# Patient Record
Sex: Female | Born: 1995 | Race: Black or African American | Hispanic: No | Marital: Single | Smoking: Never smoker
Health system: Southern US, Community
[De-identification: ages and names within clinical notes are randomized; demographics above are authoritative.]

## PROBLEM LIST (undated history)

## (undated) ENCOUNTER — Inpatient Hospital Stay (HOSPITAL_COMMUNITY): Payer: Self-pay

## (undated) DIAGNOSIS — F419 Anxiety disorder, unspecified: Secondary | ICD-10-CM

## (undated) DIAGNOSIS — C8590 Non-Hodgkin lymphoma, unspecified, unspecified site: Secondary | ICD-10-CM

## (undated) DIAGNOSIS — K219 Gastro-esophageal reflux disease without esophagitis: Secondary | ICD-10-CM

## (undated) DIAGNOSIS — J45909 Unspecified asthma, uncomplicated: Secondary | ICD-10-CM

## (undated) DIAGNOSIS — F329 Major depressive disorder, single episode, unspecified: Secondary | ICD-10-CM

## (undated) DIAGNOSIS — O139 Gestational [pregnancy-induced] hypertension without significant proteinuria, unspecified trimester: Secondary | ICD-10-CM

## (undated) DIAGNOSIS — K5900 Constipation, unspecified: Secondary | ICD-10-CM

## (undated) DIAGNOSIS — B009 Herpesviral infection, unspecified: Secondary | ICD-10-CM

## (undated) DIAGNOSIS — F32A Depression, unspecified: Secondary | ICD-10-CM

## (undated) HISTORY — DX: Depression, unspecified: F32.A

## (undated) HISTORY — PX: OTHER SURGICAL HISTORY: SHX169

## (undated) HISTORY — DX: Gastro-esophageal reflux disease without esophagitis: K21.9

## (undated) HISTORY — PX: TONSILLECTOMY: SUR1361

## (undated) HISTORY — DX: Unspecified asthma, uncomplicated: J45.909

## (undated) HISTORY — DX: Constipation, unspecified: K59.00

## (undated) HISTORY — DX: Anxiety disorder, unspecified: F41.9

## (undated) HISTORY — PX: WISDOM TOOTH EXTRACTION: SHX21

## (undated) HISTORY — PX: LYMPH GLAND EXCISION: SHX13

---
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
## 2015-03-23 DIAGNOSIS — O0992 Supervision of high risk pregnancy, unspecified, second trimester: Secondary | ICD-10-CM | POA: Insufficient documentation

## 2015-03-25 DIAGNOSIS — Z283 Underimmunization status: Secondary | ICD-10-CM

## 2015-03-25 DIAGNOSIS — O09899 Supervision of other high risk pregnancies, unspecified trimester: Secondary | ICD-10-CM | POA: Insufficient documentation

## 2015-03-25 DIAGNOSIS — Z2839 Other underimmunization status: Secondary | ICD-10-CM | POA: Insufficient documentation

## 2015-05-22 DIAGNOSIS — O163 Unspecified maternal hypertension, third trimester: Secondary | ICD-10-CM | POA: Insufficient documentation

## 2015-09-13 DIAGNOSIS — O10919 Unspecified pre-existing hypertension complicating pregnancy, unspecified trimester: Secondary | ICD-10-CM | POA: Insufficient documentation

## 2015-09-18 ENCOUNTER — Encounter (HOSPITAL_COMMUNITY): Payer: Self-pay | Admitting: Emergency Medicine

## 2015-09-18 ENCOUNTER — Emergency Department (HOSPITAL_COMMUNITY): Payer: Medicaid Other

## 2015-09-18 ENCOUNTER — Emergency Department (HOSPITAL_COMMUNITY)
Admission: EM | Admit: 2015-09-18 | Discharge: 2015-09-18 | Disposition: A | Discharge status: Home / Self Care | Payer: Medicaid Other | Attending: Emergency Medicine | Admitting: Emergency Medicine

## 2015-09-18 DIAGNOSIS — Z8572 Personal history of non-Hodgkin lymphomas: Secondary | ICD-10-CM | POA: Insufficient documentation

## 2015-09-18 DIAGNOSIS — O9989 Other specified diseases and conditions complicating pregnancy, childbirth and the puerperium: Secondary | ICD-10-CM | POA: Insufficient documentation

## 2015-09-18 DIAGNOSIS — R000 Tachycardia, unspecified: Secondary | ICD-10-CM | POA: Insufficient documentation

## 2015-09-18 DIAGNOSIS — R1084 Generalized abdominal pain: Secondary | ICD-10-CM | POA: Diagnosis not present

## 2015-09-18 HISTORY — DX: Non-Hodgkin lymphoma, unspecified, unspecified site: C85.90

## 2015-09-18 LAB — COMPREHENSIVE METABOLIC PANEL
ALBUMIN: 2.9 g/dL — AB (ref 3.5–5.0)
ALK PHOS: 125 U/L (ref 38–126)
ALT: 16 U/L (ref 14–54)
ANION GAP: 9 (ref 5–15)
AST: 14 U/L — ABNORMAL LOW (ref 15–41)
BUN: 7 mg/dL (ref 6–20)
CALCIUM: 8.5 mg/dL — AB (ref 8.9–10.3)
CHLORIDE: 104 mmol/L (ref 101–111)
CO2: 24 mmol/L (ref 22–32)
Creatinine, Ser: 0.63 mg/dL (ref 0.44–1.00)
GFR calc non Af Amer: >60 mL/min (ref >60)
GLUCOSE: 96 mg/dL (ref 65–99)
POTASSIUM: 3.6 mmol/L (ref 3.5–5.1)
SODIUM: 137 mmol/L (ref 135–145)
Total Bilirubin: 0.5 mg/dL (ref 0.3–1.2)
Total Protein: 6.7 g/dL (ref 6.5–8.1)

## 2015-09-18 LAB — CBC
HEMATOCRIT: 35.9 % — AB (ref 36.0–46.0)
HEMOGLOBIN: 11.5 g/dL — AB (ref 12.0–15.0)
MCH: 26.3 pg (ref 26.0–34.0)
MCHC: 32 g/dL (ref 30.0–36.0)
MCV: 82.2 fL (ref 78.0–100.0)
Platelets: 316 10*3/uL (ref 150–400)
RBC: 4.37 MIL/uL (ref 3.87–5.11)
RDW: 13.7 % (ref 11.5–15.5)
WBC: 16.6 10*3/uL — ABNORMAL HIGH (ref 4.0–10.5)

## 2015-09-18 LAB — LIPASE, BLOOD: LIPASE: 24 U/L (ref 11–51)

## 2015-09-18 MED ORDER — IOHEXOL 300 MG/ML  SOLN
100.0000 mL | Freq: Once | INTRAMUSCULAR | Status: AC | PRN
  Administered 2015-09-18: 100 mL via INTRAVENOUS

## 2015-09-18 MED ORDER — IOHEXOL 300 MG/ML  SOLN
25.0000 mL | Freq: Once | INTRAMUSCULAR | Status: AC | PRN
  Administered 2015-09-18: 25 mL via ORAL

## 2015-09-18 MED ORDER — AMOXICILLIN-POT CLAVULANATE 875-125 MG PO TABS
1.0000 | ORAL_TABLET | Freq: Once | ORAL | Status: AC
  Administered 2015-09-18: 1 via ORAL
  Filled 2015-09-18: qty 1

## 2015-09-18 MED ORDER — AMOXICILLIN-POT CLAVULANATE 875-125 MG PO TABS: 1.0000 | ORAL_TABLET | Freq: Two times a day (BID) | ORAL | Status: DC

## 2015-09-18 MED ORDER — FENTANYL CITRATE (PF) 100 MCG/2ML IJ SOLN
25.0000 ug | Freq: Once | INTRAMUSCULAR | Status: AC
  Administered 2015-09-18: 25 ug via INTRAVENOUS
  Filled 2015-09-18: qty 2

## 2015-09-18 MED ORDER — SODIUM CHLORIDE 0.9 % IV BOLUS (SEPSIS)
1000.0000 mL | Freq: Once | INTRAVENOUS | Status: AC
  Administered 2015-09-18: 1000 mL via INTRAVENOUS

## 2015-09-18 NOTE — ED Notes (Signed)
Pt. Is unable to urinate at this time.  

## 2015-09-18 NOTE — ED Provider Notes (Signed)
CSN: [id number]     Arrival date & time 09/18/15  0932 History   First MD Initiated Contact with Patient 09/18/15 1314     Chief Complaint  Patient presents with  . Abdominal Pain    pain in abdomen x 6 hours. . Pt is 5 days postpartum     (Consider location/radiation/quality/duration/timing/severity/associated sxs/prior Treatment) HPI   She presents with concern of abdominal pain. Symptoms began within the past 12 hours, insulin sensitive been persistent with mild diffuse abdominal discomfort. No associated nausea, vomiting, diarrhea, no dysuria. She describes vaginal discharge as typical since delivery of her child 5 days ago. She reports that delivery was uneventful. She denies fever at home. She states that her recovery has been otherwise uneventful since delivery. This was her second child. She denies other medical problems, though acknowledges a history of non-Hodgkin's lymphoma as a child.    Past Medical History  Diagnosis Date  . NVD (normal vaginal delivery)     x2  . Non Hodgkin's lymphoma ([place])     at age 60   Past Surgical History  Procedure Laterality Date  . Lymph gland excision    . Tonsillectomy     Family History  Problem Relation Age of Onset  . Diabetes Mother   . Hypertension Mother   . Diabetes Father   . Hypertension Father    Social History  Substance Use Topics  . Smoking status: Never Smoker   . Smokeless tobacco: None  . Alcohol Use: No   OB History    Gravida Para Term Preterm AB TAB SAB Ectopic Multiple Living   2 2 2             Review of Systems  Constitutional:       Per HPI, otherwise negative  HENT:       Per HPI, otherwise negative  Respiratory:       Per HPI, otherwise negative  Cardiovascular:       Per HPI, otherwise negative  Gastrointestinal: Negative for vomiting.  Endocrine:       Negative aside from HPI  Genitourinary:       Neg aside from HPI   Musculoskeletal:       Per HPI, otherwise negative  Skin:  Negative.   Neurological: Negative for syncope.      Allergies  Review of patient's allergies indicates no known allergies.  Home Medications   Prior to Admission medications   Not on File   BP 131/87 mmHg  Pulse 119  Temp(Src) 99.8 F (37.7 C) (Oral)  Resp 18  SpO2 100%  LMP 12/09/2014 (Approximate) Physical Exam  Constitutional: She is oriented to person, place, and time. She appears well-developed and well-nourished. No distress.  HENT:  Head: Normocephalic and atraumatic.  Eyes: Conjunctivae and EOM are normal.  Cardiovascular: Regular rhythm.  Tachycardia present.   Pulmonary/Chest: Effort normal and breath sounds normal. No stridor. No respiratory distress.  Abdominal: She exhibits no distension.  Mild diffuse tenderness throughout the abdomen, no rebound, no guarding  Musculoskeletal: She exhibits no edema.  Neurological: She is alert and oriented to person, place, and time. No cranial nerve deficit.  Skin: Skin is warm and dry.  Psychiatric: She has a normal mood and affect.  Nursing note and vitals reviewed.   ED Course  Procedures (including critical care time) Labs Review Labs Reviewed  COMPREHENSIVE METABOLIC PANEL - Abnormal; Notable for the following:    Calcium 8.5 (*)    Albumin 2.9 (*)  AST 14 (*)    All other components within normal limits  CBC - Abnormal; Notable for the following:    WBC 16.6 (*)    Hemoglobin 11.5 (*)    HCT 35.9 (*)    All other components within normal limits  LIPASE, BLOOD  URINALYSIS, ROUTINE W REFLEX MICROSCOPIC (NOT AT [facility])    With concern for appendicitis, though endometritis seems more likely, CT scan ordered.  Imaging Review Ct Abdomen Pelvis W Contrast  09/18/2015  CLINICAL DATA:  Right lower quadrant pain. Vaginal delivery 5 days ago. Fever and elevated white count. EXAM: CT ABDOMEN AND PELVIS WITH CONTRAST TECHNIQUE: Multidetector CT imaging of the abdomen and pelvis was performed using the standard  protocol following bolus administration of intravenous contrast. CONTRAST:  45mL OMNIPAQUE IOHEXOL 300 MG/ML SOLN, 152mL OMNIPAQUE IOHEXOL 300 MG/ML SOLN COMPARISON:  None. FINDINGS: Lung bases are clear.  No pleural or pericardial fluid. The liver has normal appearance without focal lesions or biliary ductal dilatation. The patient does have gallstones. No sign of gallbladder inflammation by CT. The spleen is normal. The pancreas is normal. The adrenal glands are normal. The kidneys are normal. The aorta and IVC are normal. No retroperitoneal mass or adenopathy. The appendix is normal. Typical postpartum uterine enlargement without apparent complicating feature. No evidence of ovarian pathology. No free fluid. Bony structures appear unremarkable. IMPRESSION: No cause of pain, fever or elevated white count identified by CT. The patient does have cholelithiasis but there is no CT evidence of cholecystitis or obstruction. That is not excluded. Normal appearance of the postpartum uterus. Normal appearance of the appendix. Electronically Signed   By: Nelson Chimes M.D.   On: 09/18/2015 15:23   I have personally reviewed and evaluated these images and lab results as part of my medical decision-making.   3:37 PM On repeat exam the patient remains in no distress. Tachycardia has diminished. I discussed patient's case with her obstetrics team, reviewed the CT findings, labs, concern for endometritis per They will arrange follow-up tomorrow with her via telephone.  MDM  Young healthy female presents for days after delivery with diffuse abdominal pain. Patient has mild temperature increase here, diffuse abdominal pain, but no peritonitis. Patient's CT scan was reassuring, and physical exam findings are consistent with mild endometritis. Patient prefers outpatient management. Patient started on oral antibiotic, discharged with prearranged follow-up care.   Carmin Muskrat, MD 09/18/15 [phone]

## 2015-09-18 NOTE — Discharge Instructions (Signed)
As discussed, it is important that you follow up as soon as possible with your physician for continued management of your condition.  Your symptoms are likely due to endometritis, which is inflammation of the uterus.  If you develop any new, or concerning changes in your condition, please return to the emergency department immediately.

## 2015-09-18 NOTE — ED Notes (Signed)
Pt reports lower abdominal pain 5 days postpartum. Pt stated that she had a NVD 5 days ago. Healthy newborn, no complications. Pt denies NVD, denies fever at home. Vaginal discharge "normal , dark red with clots"

## 2015-09-18 NOTE — ED Notes (Signed)
Patient transported to CT 

## 2016-06-01 ENCOUNTER — Emergency Department (HOSPITAL_COMMUNITY)
Admission: EM | Admit: 2016-06-01 | Discharge: 2016-06-01 | Disposition: A | Discharge status: Home / Self Care | Payer: Medicaid Other | Attending: Emergency Medicine | Admitting: Emergency Medicine

## 2016-06-01 ENCOUNTER — Encounter (HOSPITAL_COMMUNITY): Payer: Self-pay

## 2016-06-01 DIAGNOSIS — B9689 Other specified bacterial agents as the cause of diseases classified elsewhere: Secondary | ICD-10-CM | POA: Insufficient documentation

## 2016-06-01 DIAGNOSIS — J028 Acute pharyngitis due to other specified organisms: Secondary | ICD-10-CM | POA: Insufficient documentation

## 2016-06-01 LAB — RAPID STREP SCREEN (MED CTR MEBANE ONLY): STREPTOCOCCUS, GROUP A SCREEN (DIRECT): NEGATIVE

## 2016-06-01 MED ORDER — IBUPROFEN 100 MG/5ML PO SUSP
800.0000 mg | Freq: Once | ORAL | Status: AC
  Administered 2016-06-01: 800 mg via ORAL
  Filled 2016-06-01: qty 40

## 2016-06-01 NOTE — ED Provider Notes (Signed)
[facility] DEPT Provider Note   CSN: [id number] Arrival date & time: 06/01/16  1336     History   Chief Complaint Chief Complaint  Patient presents with  . Sore Throat    HPI Christina Gilbert is a 20 y.o. female.  The history is provided by the patient.  Sore Throat  This is a new problem. The current episode started 2 days ago. The problem occurs constantly. The problem has been gradually worsening. Pertinent negatives include no chest pain, no abdominal pain, no headaches and no shortness of breath. Nothing aggravates the symptoms. Nothing relieves the symptoms. She has tried nothing for the symptoms. The treatment provided no relief.    Past Medical History:  Diagnosis Date  . Non Hodgkin's lymphoma ([place])    at age 24  . NVD (normal vaginal delivery)    x2    There are no active problems to display for this patient.   Past Surgical History:  Procedure Laterality Date  . LYMPH GLAND EXCISION    . TONSILLECTOMY      OB History    Gravida Para Term Preterm AB Living   2 2 2          SAB TAB Ectopic Multiple Live Births                   Home Medications    Prior to Admission medications   Medication Sig Start Date End Date Taking? Authorizing Provider  amoxicillin-clavulanate (AUGMENTIN) 875-125 MG tablet Take 1 tablet by mouth 2 (two) times daily. 09/18/15   Carmin Muskrat, MD    Family History Family History  Problem Relation Age of Onset  . Diabetes Mother   . Hypertension Mother   . Diabetes Father   . Hypertension Father     Social History Social History  Substance Use Topics  . Smoking status: Never Smoker  . Smokeless tobacco: Never Used  . Alcohol use No     Allergies   Review of patient's allergies indicates no known allergies.   Review of Systems Review of Systems  Constitutional: Negative for chills and fever.  HENT: Positive for congestion and rhinorrhea. Negative for ear pain and sore throat.   Eyes: Negative for pain  and visual disturbance.  Respiratory: Positive for cough. Negative for shortness of breath.   Cardiovascular: Negative for chest pain and palpitations.  Gastrointestinal: Negative for abdominal pain and vomiting.  Genitourinary: Negative for dysuria and hematuria.  Musculoskeletal: Negative for arthralgias and back pain.  Skin: Negative for color change and rash.  Neurological: Negative for seizures, syncope and headaches.  All other systems reviewed and are negative.    Physical Exam Updated Vital Signs BP 122/67 (BP Location: Left Arm)   Pulse 110   Temp 99.5 F (37.5 C) (Oral)   Resp 16   Ht 5\' 7"  (1.702 m)   Wt 280 lb (127 kg)   SpO2 100%   BMI 43.85 kg/m   Physical Exam  Constitutional: She is oriented to person, place, and time. She appears well-developed and well-nourished. No distress.  HENT:  Head: Normocephalic and atraumatic.  Nose: Nose normal.  Mouth/Throat: Oropharyngeal exudate and posterior oropharyngeal erythema present. No posterior oropharyngeal edema or tonsillar abscesses.  Eyes: Conjunctivae and EOM are normal. Pupils are equal, round, and reactive to light. Right eye exhibits no discharge. Left eye exhibits no discharge. No scleral icterus.  Neck: Normal range of motion. Neck supple.  Cardiovascular: Regular rhythm.  Tachycardia present.  Exam reveals  no gallop and no friction rub.   No murmur heard. Pulmonary/Chest: Effort normal and breath sounds normal. No stridor. No respiratory distress. She has no rales.  Abdominal: Soft. She exhibits no distension. There is no tenderness.  Musculoskeletal: She exhibits no edema or tenderness.  Neurological: She is alert and oriented to person, place, and time.  Skin: Skin is warm and dry. No rash noted. She is not diaphoretic. No erythema.  Psychiatric: She has a normal mood and affect.  Vitals reviewed.    ED Treatments / Results  Labs (all labs ordered are listed, but only abnormal results are  displayed) Labs Reviewed  RAPID STREP SCREEN (NOT AT [facility])  CULTURE, GROUP A STREP [place])    EKG  EKG Interpretation None       Radiology No results found.  Procedures Procedures (including critical care time)  Medications Ordered in ED Medications  ibuprofen (ADVIL,MOTRIN) 100 MG/5ML suspension 800 mg (800 mg Oral Given 06/01/16 2020)     Initial Impression / Assessment and Plan / ED Course  I have reviewed the triage vital signs and the nursing notes.  Pertinent labs & imaging results that were available during my care of the patient were reviewed by me and considered in my medical decision making (see chart for details).  Clinical Course    Febrile with pharyngitis. Rapid strep negative. Tachycardia improved with fever reduction. Like viral process. Tolerating PO intake.  The patient is safe for discharge with strict return precautions.   Final Clinical Impressions(s) / ED Diagnoses   Final diagnoses:  Pharyngitis due to other organism   Disposition: Discharge  Condition: Good  I have discussed the results, Dx and Tx plan with the patient who expressed understanding and agree(s) with the plan. Discharge instructions discussed at great length. The patient was given strict return precautions who verbalized understanding of the instructions. No further questions at time of discharge.    Discharge Medication List as of 06/01/2016  9:57 PM      Follow Up: [facility] [place] [id number] [phone]  Schedule an appointment as soon as possible for a visit  in 3-5 days, If symptoms do not improve or  worsen      Fatima Blank, MD 06/02/16 [phone]

## 2016-06-01 NOTE — ED Notes (Signed)
Unable to locate pt for discharge instructions

## 2016-06-01 NOTE — ED Notes (Signed)
Pt c/o sore throat with difficulty swallowing. Airway intact, sats WNL. Pt also reports fever at home

## 2016-06-01 NOTE — ED Notes (Signed)
Unable to locate pt  

## 2016-06-03 LAB — CULTURE, GROUP A STREP (THRC)

## 2016-08-03 NOTE — L&D Delivery Note (Signed)
Delivery Note At 2:02 PM a viable female was delivered via Vaginal, Spontaneous Delivery (Presentation: ROA).  APGAR: 9, 9; weight  pending.   Placenta status: intact, .  Cord: 3 vessels with the following complications: none.  Cord pH: N/A  Anesthesia:   Episiotomy: None Lacerations: None Suture Repair: N/A Est. Blood Loss (mL): 50  Mom to postpartum.  Baby to Couplet care / Skin to Skin.  Luiz Blare, DO 02/15/2017, 2:45 PM

## 2016-08-25 ENCOUNTER — Encounter (HOSPITAL_COMMUNITY): Payer: Self-pay | Admitting: *Deleted

## 2016-08-25 ENCOUNTER — Inpatient Hospital Stay (HOSPITAL_COMMUNITY)
Admission: AD | Admit: 2016-08-25 | Discharge: 2016-08-25 | Disposition: A | Discharge status: Home / Self Care | Payer: Medicaid Other | Source: Ambulatory Visit | Attending: Family Medicine | Admitting: Family Medicine

## 2016-08-25 DIAGNOSIS — O210 Mild hyperemesis gravidarum: Secondary | ICD-10-CM | POA: Diagnosis not present

## 2016-08-25 DIAGNOSIS — O2342 Unspecified infection of urinary tract in pregnancy, second trimester: Secondary | ICD-10-CM | POA: Diagnosis not present

## 2016-08-25 DIAGNOSIS — O219 Vomiting of pregnancy, unspecified: Secondary | ICD-10-CM | POA: Diagnosis not present

## 2016-08-25 DIAGNOSIS — O10919 Unspecified pre-existing hypertension complicating pregnancy, unspecified trimester: Secondary | ICD-10-CM

## 2016-08-25 DIAGNOSIS — Z8572 Personal history of non-Hodgkin lymphomas: Secondary | ICD-10-CM | POA: Insufficient documentation

## 2016-08-25 DIAGNOSIS — Z3492 Encounter for supervision of normal pregnancy, unspecified, second trimester: Secondary | ICD-10-CM

## 2016-08-25 DIAGNOSIS — Z3A15 15 weeks gestation of pregnancy: Secondary | ICD-10-CM | POA: Diagnosis not present

## 2016-08-25 DIAGNOSIS — O10012 Pre-existing essential hypertension complicating pregnancy, second trimester: Secondary | ICD-10-CM

## 2016-08-25 DIAGNOSIS — Z7982 Long term (current) use of aspirin: Secondary | ICD-10-CM | POA: Insufficient documentation

## 2016-08-25 HISTORY — DX: Gestational (pregnancy-induced) hypertension without significant proteinuria, unspecified trimester: O13.9

## 2016-08-25 LAB — BASIC METABOLIC PANEL
Anion gap: 9 (ref 5–15)
BUN: <5 mg/dL — ABNORMAL LOW (ref 6–20)
CO2: 25 mmol/L (ref 22–32)
Calcium: 9.4 mg/dL (ref 8.9–10.3)
Chloride: 103 mmol/L (ref 101–111)
Creatinine, Ser: 0.49 mg/dL (ref 0.44–1.00)
GFR calc Af Amer: >60 mL/min (ref >60)
GLUCOSE: 95 mg/dL (ref 65–99)
POTASSIUM: 3.5 mmol/L (ref 3.5–5.1)
Sodium: 137 mmol/L (ref 135–145)

## 2016-08-25 LAB — CBC
HCT: 39 % (ref 36.0–46.0)
HEMOGLOBIN: 13.7 g/dL (ref 12.0–15.0)
MCH: 28 pg (ref 26.0–34.0)
MCHC: 35.1 g/dL (ref 30.0–36.0)
MCV: 79.8 fL (ref 78.0–100.0)
Platelets: 383 10*3/uL (ref 150–400)
RBC: 4.89 MIL/uL (ref 3.87–5.11)
RDW: 14.1 % (ref 11.5–15.5)
WBC: 10.7 10*3/uL — ABNORMAL HIGH (ref 4.0–10.5)

## 2016-08-25 LAB — URINALYSIS, ROUTINE W REFLEX MICROSCOPIC
BACTERIA UA: NONE SEEN
Glucose, UA: NEGATIVE mg/dL
HGB URINE DIPSTICK: NEGATIVE
Ketones, ur: 80 mg/dL — AB
NITRITE: NEGATIVE
PROTEIN: 100 mg/dL — AB
SPECIFIC GRAVITY, URINE: 1.038 — AB (ref 1.005–1.030)
pH: 5 (ref 5.0–8.0)

## 2016-08-25 LAB — POCT PREGNANCY, URINE: PREG TEST UR: POSITIVE — AB

## 2016-08-25 LAB — PREALBUMIN: Prealbumin: 27.4 mg/dL (ref 18–38)

## 2016-08-25 MED ORDER — NITROFURANTOIN MONOHYD MACRO 100 MG PO CAPS: 100.0000 mg | ORAL_CAPSULE | Freq: Two times a day (BID) | ORAL | 0 refills | Status: DC

## 2016-08-25 MED ORDER — ASPIRIN EC 81 MG PO TBEC: 81.0000 mg | DELAYED_RELEASE_TABLET | Freq: Every day | ORAL | 4 refills | Status: DC

## 2016-08-25 MED ORDER — LACTATED RINGERS IV BOLUS (SEPSIS)
1000.0000 mL | Freq: Once | INTRAVENOUS | Status: AC
  Administered 2016-08-25: 1000 mL via INTRAVENOUS

## 2016-08-25 MED ORDER — PROMETHAZINE HCL 25 MG/ML IJ SOLN
12.5000 mg | Freq: Once | INTRAMUSCULAR | Status: AC
  Administered 2016-08-25: 12.5 mg via INTRAVENOUS
  Filled 2016-08-25: qty 1

## 2016-08-25 MED ORDER — PROMETHAZINE HCL 12.5 MG PO TABS: 12.5000 mg | ORAL_TABLET | Freq: Four times a day (QID) | ORAL | 0 refills | Status: DC | PRN

## 2016-08-25 NOTE — MAU Note (Signed)
Doesn't know if she is preganant, has been having symptoms, throwing up for almost a month.  Pain- knots in upper stomach when throwing up.  Can't eat anything.  Has not done home test. Diarrhea past 2 days.

## 2016-08-25 NOTE — MAU Provider Note (Signed)
Chief Complaint: Possible Pregnancy and Emesis   SUBJECTIVE HPI: Christina Gilbert is a 21 y.o. G3P2002 at [redacted]w[redacted]d who presents to Maternity Admissions reporting severe nausea/vomiting.  Patient states that she has had increased morning sickness since November but the past 2 weeks had severe nausea and vomiting and unable to take any oral intake. After today's weight she states she has lost about 26 pounds from her baseline weight. Her baseline weight was 298 lbs and today she weighs 272 lbs.  patient has not taken any medication for her nausea and vomiting. Patient was suspicious that she may be pregnant, she has not taken any contraception since her last baby was born in February except for one dose of Depo after her delivery.   Patient is a high risk OB patient due to chronic hypertension, diagnosed today with 2 elevated blood pressures greater than 4 hours apart. Has history of gestational hypertension with preeclampsia. Patient also has a history of non-Hodgkin's lymphoma when she was a child, she has been in remission for 10 years. Has had 2 normal spontaneous vaginal deliveries.     Past Medical History:  Diagnosis Date  . Non Hodgkin's lymphoma ([place])    at age 79  . NVD (normal vaginal delivery)    x2  . Pregnancy induced hypertension    OB History  Gravida Para Term Preterm AB Living  3 2 2     2   SAB TAB Ectopic Multiple Live Births          2    # Outcome Date GA Lbr Len/2nd Weight Sex Delivery Anes PTL Lv  3 Current           2 Term      Vag-Spont   LIV  1 Term      Vag-Spont   LIV     Past Surgical History:  Procedure Laterality Date  . LYMPH GLAND EXCISION    . TONSILLECTOMY     Social History   Social History  . Marital status: Single    Spouse name: N/A  . Number of children: N/A  . Years of education: N/A   Occupational History  . Not on file.   Social History Main Topics  . Smoking status: Never Smoker  . Smokeless tobacco: Never Used  . Alcohol use No   . Drug use: Unknown  . Sexual activity: Not on file   Other Topics Concern  . Not on file   Social History Narrative  . No narrative on file   No current facility-administered medications on file prior to encounter.    No current outpatient prescriptions on file prior to encounter.   No Known Allergies  I have reviewed the past Medical Hx, Surgical Hx, Social Hx, Allergies and Medications.   REVIEW OF SYSTEMS  A comprehensive ROS was negative except per HPI.    OBJECTIVE Patient Vitals for the past 24 hrs:  BP Temp Temp src Pulse Resp Weight  08/25/16 2133 148/74 - - 90 - -  08/25/16 1658 152/78 98.6 F (37 C) Oral 101 20 272 lb 8 oz (123.6 kg)    PHYSICAL EXAM Constitutional: Well-developed, well-nourished female in no acute distress. Looks comfortable in room. Cardiovascular: normal rate, rhythm, no murmurs Respiratory: normal rate and effort. CTAB GI: Abd soft, non-tender, non-distended. Pos BS x 4 MS: Extremities nontender, no edema, normal ROM Neurologic: Alert and oriented x 4.  GU: Neg CVAT.  LAB RESULTS Results for orders placed or performed during the  hospital encounter of 08/25/16 (from the past 24 hour(s))  Urinalysis, Routine w reflex microscopic     Status: Abnormal   Collection Time: 08/25/16  5:38 PM  Result Value Ref Range   Color, Urine AMBER (A) YELLOW   APPearance HAZY (A) CLEAR   Specific Gravity, Urine 1.038 (H) 1.005 - 1.030   pH 5.0 5.0 - 8.0   Glucose, UA NEGATIVE NEGATIVE mg/dL   Hgb urine dipstick NEGATIVE NEGATIVE   Bilirubin Urine SMALL (A) NEGATIVE   Ketones, ur 80 (A) NEGATIVE mg/dL   Protein, ur 100 (A) NEGATIVE mg/dL   Nitrite NEGATIVE NEGATIVE   Leukocytes, UA LARGE (A) NEGATIVE   RBC / HPF 6-30 0 - 5 RBC/hpf   WBC, UA TOO NUMEROUS TO COUNT 0 - 5 WBC/hpf   Bacteria, UA NONE SEEN NONE SEEN   Squamous Epithelial / LPF 0-5 (A) NONE SEEN   Mucous PRESENT   Pregnancy, urine POC     Status: Abnormal   Collection Time: 08/25/16   5:43 PM  Result Value Ref Range   Preg Test, Ur POSITIVE (A) NEGATIVE  Basic metabolic panel     Status: Abnormal   Collection Time: 08/25/16  7:45 PM  Result Value Ref Range   Sodium 137 135 - 145 mmol/L   Potassium 3.5 3.5 - 5.1 mmol/L   Chloride 103 101 - 111 mmol/L   CO2 25 22 - 32 mmol/L   Glucose, Bld 95 65 - 99 mg/dL   BUN <5 (L) 6 - 20 mg/dL   Creatinine, Ser 0.49 0.44 - 1.00 mg/dL   Calcium 9.4 8.9 - 10.3 mg/dL   GFR calc non Af Amer >60 >60 mL/min   GFR calc Af Amer >60 >60 mL/min   Anion gap 9 5 - 15  Prealbumin     Status: None   Collection Time: 08/25/16  7:45 PM  Result Value Ref Range   Prealbumin 27.4 18 - 38 mg/dL  CBC     Status: Abnormal   Collection Time: 08/25/16  7:45 PM  Result Value Ref Range   WBC 10.7 (H) 4.0 - 10.5 K/uL   RBC 4.89 3.87 - 5.11 MIL/uL   Hemoglobin 13.7 12.0 - 15.0 g/dL   HCT 39.0 36.0 - 46.0 %   MCV 79.8 78.0 - 100.0 fL   MCH 28.0 26.0 - 34.0 pg   MCHC 35.1 30.0 - 36.0 g/dL   RDW 14.1 11.5 - 15.5 %   Platelets 383 150 - 400 K/uL    MAU COURSE CBC, BMP, prealbumin FHT by Doppler 150s UA/UCX pending Fundal height below umbilicus, about accurate with [redacted] week EGA IVF bolus (patient did not think she could tolerate anything orally) IV Phenergan PO challenge - tolerated  MDM Plan of care reviewed with patient, including labs and tests ordered and medical treatment.  Reviewed with patient treatment for her nausea and vomiting. The prealbumin was drawn today and to be followed up by her OB provider, there is concern with her 26 pound weight loss. She was able to tolerate by mouth intake after IV Phenergan was given, and therefore she was sent home with PO Phenergan. It was discussed with patient that she has chronic hypertension due to her elevated blood pressures today that were greater than 4 hours apart. To take a baby aspirin every day now that she is greater than 14 weeks, this was sent to her pharmacy. He shouldn't with her OB  provider for her blood pressure medication if  indicated. A anatomy scan was set up to be performed outpatient  Until she is able to follow up with an OB provider. List of OB providers in the area were given to her. It was also cleaned her that her urinalysis showed signs of pyuria suspicious for UTI, she was given antibiotics for this. She has no signs of pyelonephritis.   ASSESSMENT 1. Second trimester pregnancy   2. Nausea/vomiting in pregnancy   3. Urinary tract infection in mother during second trimester of pregnancy   4. Chronic hypertension affecting pregnancy     PLAN Discharge home in stable condition. Follow up soon with OB provider of choice, list given to patient for providers in area Phenergan for nausea/vomiting Macrobid for UTI Baby ASA daily Follow up for BPs Encouraged PO intake especially hydration  Follow-up Information    OB Provider of choice. Schedule an appointment as soon as possible for a visit in 1 week(s).          Allergies as of 08/25/2016   No Known Allergies     Medication List    TAKE these medications   acetaminophen 500 MG tablet Commonly known as:  TYLENOL Take 1,000 mg by mouth every 6 (six) hours as needed for headache.   aspirin EC 81 MG tablet Take 1 tablet (81 mg total) by mouth daily.   nitrofurantoin (macrocrystal-monohydrate) 100 MG capsule Commonly known as:  MACROBID Take 1 capsule (100 mg total) by mouth 2 (two) times daily.   promethazine 12.5 MG tablet Commonly known as:  PHENERGAN Take 1 tablet (12.5 mg total) by mouth every 6 (six) hours as needed for nausea or vomiting. Medication may make you drowsy.        Katherine Basset, DO [place] [facility] 08/25/2016 10:09 PM

## 2016-08-25 NOTE — Discharge Instructions (Signed)
Pregnancy and Urinary Tract Infection WHAT IS A URINARY TRACT INFECTION? A urinary tract infection (UTI) is an infection of any part of the urinary tract. This includes the kidneys, the tubes that connect your kidneys to your bladder (ureters), the bladder, and the tube that carries urine out of your body (urethra). These organs make, store, and get rid of urine in the body. A UTI can be a bladder infection (cystitis) or a kidney infection (pyelonephritis). This infection may be caused by fungi, viruses, and bacteria. Bacteria are the most common cause of UTIs. You are more likely to develop a UTI during pregnancy because:  The physical and hormonal changes your body goes through can make it easier for bacteria to get into your urinary tract.  Your growing baby puts pressure on your uterus and can affect urine flow. DOES A UTI PLACE MY BABY AT RISK? An untreated UTI during pregnancy could lead to a kidney infection, which can cause health problems that could affect your baby. Possible complications of an untreated UTI include:  Having your baby before 37 weeks of pregnancy (premature).  Having a baby with a low birth weight.  Developing high blood pressure during pregnancy (preeclampsia). WHAT ARE THE SYMPTOMS OF A UTI? Symptoms of a UTI include:  Fever.  Frequent urination or passing small amounts of urine frequently.  Needing to urinate urgently.  Pain or a burning sensation with urination.  Urine that smells bad or unusual.  Cloudy urine.  Pain in the lower abdomen or back.  Trouble urinating.  Blood in the urine.  Vomiting or being less hungry than normal.  Diarrhea or abdominal pain.  Vaginal discharge. WHAT ARE THE TREATMENT OPTIONS FOR A UTI DURING PREGNANCY? Treatment for this condition may include:  Antibiotic medicines that are safe to take during pregnancy.  Other medicines to treat less common causes of UTI. HOW CAN I PREVENT A UTI? To prevent a UTI:  Go  to the bathroom as soon as you feel the need.  Always wipe from front to back.  Wash your genital area with soap and warm water daily.  Empty your bladder before and after sex.  Wear cotton underwear.  Limit your intake of high sugar foods or drinks, such as regular soda, juice, and sweets..  Drink 6-8 glasses of water daily.  Do not wear tight-fitting pants.  Do not douche or use deodorant sprays.  Do not drink alcohol, caffeine, or carbonated drinks. These can irritate the bladder. WHEN SHOULD I SEEK MEDICAL CARE? Seek medical care if:  Your symptoms do not improve or get worse.  You have a fever after two days of treatment.  You have a rash.  You have abnormal vaginal discharge.  You have back or side pain.  You have chills.  You have nausea and vomiting. WHEN SHOULD I SEEK IMMEDIATE MEDICAL CARE? Seek immediate medical care if you are pregnant and:  You feel contractions in your uterus.  You have lower belly pain.  You have a gush of fluid from your vagina.  You have blood in your urine.  You are vomiting and cannot keep down any medicines or water. This information is not intended to replace advice given to you by your health care provider. Make sure you discuss any questions you have with your health care provider. Document Released: 11/14/2010 Document Revised: 12/23/2015 Document Reviewed: 06/10/2015 Elsevier Interactive Patient Education  2017 [place] of Pregnancy The second trimester is from week 13 through  week 28, month 4 through 6. This is often the time in pregnancy that you feel your best. Often times, morning sickness has lessened or quit. You may have more energy, and you may get hungry more often. Your unborn baby (fetus) is growing rapidly. At the end of the sixth month, he or she is about 9 inches long and weighs about 1 pounds. You will likely feel the baby move (quickening) between 18 and 20 weeks of  pregnancy. Follow these instructions at home:  Avoid all smoking, herbs, and alcohol. Avoid drugs not approved by your doctor.  Do not use any tobacco products, including cigarettes, chewing tobacco, and electronic cigarettes. If you need help quitting, ask your doctor. You may get counseling or other support to help you quit.  Only take medicine as told by your doctor. Some medicines are safe and some are not during pregnancy.  Exercise only as told by your doctor. Stop exercising if you start having cramps.  Eat regular, healthy meals.  Wear a good support bra if your breasts are tender.  Do not use hot tubs, steam rooms, or saunas.  Wear your seat belt when driving.  Avoid raw meat, uncooked cheese, and liter boxes and soil used by cats.  Take your prenatal vitamins.  Take 1500-2000 milligrams of calcium daily starting at the 20th week of pregnancy until you deliver your baby.  Try taking medicine that helps you poop (stool softener) as needed, and if your doctor approves. Eat more fiber by eating fresh fruit, vegetables, and whole grains. Drink enough fluids to keep your pee (urine) clear or pale yellow.  Take warm water baths (sitz baths) to soothe pain or discomfort caused by hemorrhoids. Use hemorrhoid cream if your doctor approves.  If you have puffy, bulging veins (varicose veins), wear support hose. Raise (elevate) your feet for 15 minutes, 3-4 times a day. Limit salt in your diet.  Avoid heavy lifting, wear low heals, and sit up straight.  Rest with your legs raised if you have leg cramps or low back pain.  Visit your dentist if you have not gone during your pregnancy. Use a soft toothbrush to brush your teeth. Be gentle when you floss.  You can have sex (intercourse) unless your doctor tells you not to.  Go to your doctor visits. Get help if:  You feel dizzy.  You have mild cramps or pressure in your lower belly (abdomen).  You have a nagging pain in your  belly area.  You continue to feel sick to your stomach (nauseous), throw up (vomit), or have watery poop (diarrhea).  You have bad smelling fluid coming from your vagina.  You have pain with peeing (urination). Get help right away if:  You have a fever.  You are leaking fluid from your vagina.  You have spotting or bleeding from your vagina.  You have severe belly cramping or pain.  You lose or gain weight rapidly.  You have trouble catching your breath and have chest pain.  You notice sudden or extreme puffiness (swelling) of your face, hands, ankles, feet, or legs.  You have not felt the baby move in over an hour.  You have severe headaches that do not go away with medicine.  You have vision changes. This information is not intended to replace advice given to you by your health care provider. Make sure you discuss any questions you have with your health care provider. Document Released: 10/14/2009 Document Revised: 12/26/2015 Document Reviewed: 09/20/2012 Elsevier  Interactive Patient Education  2017 [employer].

## 2016-08-26 LAB — CULTURE, OB URINE

## 2016-10-05 LAB — OB RESULTS CONSOLE ANTIBODY SCREEN: ANTIBODY SCREEN: NEGATIVE

## 2016-10-05 LAB — OB RESULTS CONSOLE GC/CHLAMYDIA
Chlamydia: NEGATIVE
Gonorrhea: NEGATIVE

## 2016-10-05 LAB — OB RESULTS CONSOLE VARICELLA ZOSTER ANTIBODY, IGG: Varicella: NON-IMMUNE/NOT IMMUNE

## 2016-10-05 LAB — OB RESULTS CONSOLE HEPATITIS B SURFACE ANTIGEN: Hepatitis B Surface Ag: NEGATIVE

## 2016-10-05 LAB — OB RESULTS CONSOLE RUBELLA ANTIBODY, IGM: Rubella: IMMUNE

## 2016-10-05 LAB — OB RESULTS CONSOLE ABO/RH: RH Type: POSITIVE

## 2016-10-05 LAB — OB RESULTS CONSOLE RPR: RPR: NONREACTIVE

## 2016-10-05 LAB — GLUCOSE TOLERANCE, 1 HOUR (50G) W/O FASTING: GLUCOSE 1 HOUR GTT: 123

## 2016-10-05 LAB — OB RESULTS CONSOLE HIV ANTIBODY (ROUTINE TESTING): HIV: NONREACTIVE

## 2016-10-15 ENCOUNTER — Encounter: Payer: Medicaid Other | Admitting: Family Medicine

## 2016-10-20 ENCOUNTER — Encounter: Payer: Self-pay | Admitting: *Deleted

## 2016-10-21 ENCOUNTER — Encounter: Payer: Self-pay | Admitting: *Deleted

## 2016-10-26 ENCOUNTER — Encounter: Payer: Medicaid Other | Admitting: Family Medicine

## 2016-10-27 ENCOUNTER — Encounter (HOSPITAL_COMMUNITY): Payer: Self-pay | Admitting: *Deleted

## 2016-10-27 ENCOUNTER — Inpatient Hospital Stay (HOSPITAL_COMMUNITY)
Admission: AD | Admit: 2016-10-27 | Discharge: 2016-10-27 | Disposition: A | Discharge status: Home / Self Care | Payer: Medicaid Other | Source: Ambulatory Visit | Attending: Family Medicine | Admitting: Family Medicine

## 2016-10-27 DIAGNOSIS — O10919 Unspecified pre-existing hypertension complicating pregnancy, unspecified trimester: Secondary | ICD-10-CM | POA: Diagnosis not present

## 2016-10-27 DIAGNOSIS — N898 Other specified noninflammatory disorders of vagina: Secondary | ICD-10-CM

## 2016-10-27 DIAGNOSIS — Z3A23 23 weeks gestation of pregnancy: Secondary | ICD-10-CM

## 2016-10-27 DIAGNOSIS — O26892 Other specified pregnancy related conditions, second trimester: Secondary | ICD-10-CM | POA: Diagnosis not present

## 2016-10-27 DIAGNOSIS — O10012 Pre-existing essential hypertension complicating pregnancy, second trimester: Secondary | ICD-10-CM | POA: Insufficient documentation

## 2016-10-27 DIAGNOSIS — Z7982 Long term (current) use of aspirin: Secondary | ICD-10-CM | POA: Diagnosis not present

## 2016-10-27 LAB — URINALYSIS, MICROSCOPIC (REFLEX): RBC / HPF: NONE SEEN RBC/hpf (ref 0–5)

## 2016-10-27 LAB — URINALYSIS, ROUTINE W REFLEX MICROSCOPIC
GLUCOSE, UA: NEGATIVE mg/dL
Hgb urine dipstick: NEGATIVE
LEUKOCYTES UA: NEGATIVE
NITRITE: POSITIVE — AB
PH: 6.5 (ref 5.0–8.0)
PROTEIN: 100 mg/dL — AB
SPECIFIC GRAVITY, URINE: 1.025 (ref 1.005–1.030)

## 2016-10-27 NOTE — MAU Note (Signed)
Pt presents to MAU with complaints of passing her mucous plug today. Denies any vaginal bleeding. States that she has a history of HTN.

## 2016-10-27 NOTE — Discharge Instructions (Signed)
Abdominal Pain During Pregnancy °Belly (abdominal) pain is common during pregnancy. Most of the time, it is not a serious problem. Other times, it can be a sign that something is wrong with the pregnancy. Always tell your doctor if you have belly pain. °Follow these instructions at home: °Monitor your belly pain for any changes. The following actions may help you feel better: °· Do not have sex (intercourse) or put anything in your vagina until you feel better. °· Rest until your pain stops. °· Drink clear fluids if you feel sick to your stomach (nauseous). Do not eat solid food until you feel better. °· Only take medicine as told by your doctor. °· Keep all doctor visits as told. °Get help right away if: °· You are bleeding, leaking fluid, or pieces of tissue come out of your vagina. °· You have more pain or cramping. °· You keep throwing up (vomiting). °· You have pain when you pee (urinate) or have blood in your pee. °· You have a fever. °· You do not feel your baby moving as much. °· You feel very weak or feel like passing out. °· You have trouble breathing, with or without belly pain. °· You have a very bad headache and belly pain. °· You have fluid leaking from your vagina and belly pain. °· You keep having watery poop (diarrhea). °· Your belly pain does not go away after resting, or the pain gets worse. °This information is not intended to replace advice given to you by your health care provider. Make sure you discuss any questions you have with your health care provider. °Document Released: 07/08/2009 Document Revised: 02/26/2016 Document Reviewed: 02/16/2013 °Elsevier Interactive Patient Education © 2017 Elsevier Inc. ° °

## 2016-10-27 NOTE — MAU Provider Note (Signed)
History     CSN: [id number]  Arrival date and time: 10/27/16 1701   First Provider Initiated Contact with Patient 10/27/16 1806      No chief complaint on file.  [id number] @23 .4 here with mucous discharge. Discharge started 1 week ago then became very thick "like snot" 2 days ago. No recent  IC. Bobbye Riggs also reports uterine cramping yesterday that resolved after drinking water. No VB or LOF. Good FM. She was recently treated for trich 2 weeks ago. Partner has not been treated but she has not had sexual relations. She is getting prenatal care at the [facility] but transferring to [facility], appt is next week. She admits to using Marijuana. She reports no water intake, only juice and soda.    OB History    Gravida Para Term Preterm AB Living   3 2 2     2    SAB TAB Ectopic Multiple Live Births           2      Past Medical History:  Diagnosis Date  . Asthma    last used inhaler at age 18  . Non Hodgkin's lymphoma ([place])    at age 27  . NVD (normal vaginal delivery)    x2  . Pregnancy induced hypertension     Past Surgical History:  Procedure Laterality Date  . LYMPH GLAND EXCISION    . TONSILLECTOMY      Family History  Problem Relation Age of Onset  . Diabetes Mother   . Hypertension Mother   . Diabetes Father   . Hypertension Father     Social History  Substance Use Topics  . Smoking status: Never Smoker  . Smokeless tobacco: Never Used  . Alcohol use No    Allergies: No Known Allergies  No prescriptions prior to admission.    Review of Systems  Gastrointestinal: Negative for abdominal pain.  Genitourinary: Positive for vaginal discharge. Negative for dysuria, frequency, hematuria, urgency and vaginal bleeding.   Physical Exam   Blood pressure 130/76, pulse 96, temperature 99.4 F (37.4 C), resp. rate 18, height 5\' 6"  (1.676 m), weight 118.4 kg (261 lb), last menstrual period 05/09/2016.  Physical Exam  Nursing note and vitals reviewed. Constitutional:  She appears well-developed and well-nourished. No distress.  HENT:  Head: Normocephalic and atraumatic.  Neck: Normal range of motion.  Respiratory: Effort normal. No respiratory distress.  GI: Soft. She exhibits no distension. There is no tenderness.  gravid  Genitourinary:  Genitourinary Comments: External: no lesions or erythema Vagina: rugated, parous, scant mucous discharge at os SVE: closed/thick  Musculoskeletal: Normal range of motion.  Neurological: She is alert.  Skin: Skin is warm and dry.  Psychiatric: She has a normal mood and affect.   EFM: 145 bpm, mod variability, + accels, rare variable decels Toco: nond  Results for orders placed or performed during the hospital encounter of 10/27/16 (from the past 24 hour(s))  Urinalysis, Routine w reflex microscopic     Status: Abnormal   Collection Time: 10/27/16  5:32 PM  Result Value Ref Range   Color, Urine AMBER (A) YELLOW   APPearance CLOUDY (A) CLEAR   Specific Gravity, Urine 1.025 1.005 - 1.030   pH 6.5 5.0 - 8.0   Glucose, UA NEGATIVE NEGATIVE mg/dL   Hgb urine dipstick NEGATIVE NEGATIVE   Bilirubin Urine MODERATE (A) NEGATIVE   Ketones, ur >80 (A) NEGATIVE mg/dL   Protein, ur 100 (A) NEGATIVE mg/dL   Nitrite POSITIVE (  A) NEGATIVE   Leukocytes, UA NEGATIVE NEGATIVE  Urinalysis, Microscopic (reflex)     Status: Abnormal   Collection Time: 10/27/16  5:32 PM  Result Value Ref Range   RBC / HPF NONE SEEN 0 - 5 RBC/hpf   WBC, UA 0-5 0 - 5 WBC/hpf   Bacteria, UA FEW (A) NONE SEEN   Squamous Epithelial / LPF 0-5 (A) NONE SEEN   Mucous PRESENT    MAU Course  Procedures  MDM Labs ordered and reviewed. No evidence of PTL or cervical insufficiency. No urinary sx but will send urine for culture since +nitrites. GC/CT and wet prep not indicated since recently completed. Reassured pt. Stable for discharge home.  Assessment and Plan   1. [redacted] weeks gestation of pregnancy   2. Vaginal discharge during pregnancy in second  trimester   3.      Chronic HTN affecting pregnancy  Discharge home Follow up next week with [facility] PTL precautions Abstain from IC until 3 wks after partner is treated Increase water intake to 5 bottles per day, minimize soda and juice Discontinue marijuana use  Allergies as of 10/27/2016   No Known Allergies     Medication List    STOP taking these medications   anti-nausea solution   nitrofurantoin (macrocrystal-monohydrate) 100 MG capsule Commonly known as:  MACROBID     TAKE these medications   acetaminophen 500 MG tablet Commonly known as:  TYLENOL Take 1,000 mg by mouth every 6 (six) hours as needed for headache.   aspirin EC 81 MG tablet Take 1 tablet (81 mg total) by mouth daily.   prenatal multivitamin Tabs tablet Take 1 tablet by mouth daily at 12 noon.   promethazine 12.5 MG tablet Commonly known as:  PHENERGAN Take 1 tablet (12.5 mg total) by mouth every 6 (six) hours as needed for nausea or vomiting. Medication may make you drowsy.   ranitidine 75 MG tablet Commonly known as:  ZANTAC Take 75 mg by mouth 3 (three) times daily.      Julianne Handler, CNM 10/27/2016, 7:21 PM

## 2016-10-29 LAB — CULTURE, OB URINE

## 2016-11-05 ENCOUNTER — Encounter: Payer: Self-pay | Admitting: General Practice

## 2016-11-06 DIAGNOSIS — O2340 Unspecified infection of urinary tract in pregnancy, unspecified trimester: Secondary | ICD-10-CM | POA: Insufficient documentation

## 2016-11-06 DIAGNOSIS — Z202 Contact with and (suspected) exposure to infections with a predominantly sexual mode of transmission: Secondary | ICD-10-CM | POA: Insufficient documentation

## 2016-11-19 ENCOUNTER — Encounter: Payer: Medicaid Other | Admitting: Family Medicine

## 2016-12-02 ENCOUNTER — Encounter: Payer: Medicaid Other | Admitting: Family Medicine

## 2017-02-11 ENCOUNTER — Encounter (HOSPITAL_COMMUNITY): Payer: Self-pay

## 2017-02-11 ENCOUNTER — Ambulatory Visit (HOSPITAL_COMMUNITY)
Admission: RE | Admit: 2017-02-11 | Discharge: 2017-02-11 | Disposition: A | Discharge status: Home / Self Care | Payer: Medicaid Other | Source: Ambulatory Visit | Attending: Maternal and Fetal Medicine | Admitting: Maternal and Fetal Medicine

## 2017-02-11 VITALS — BP 124/69 | HR 89 | Wt 250.1 lb

## 2017-02-11 DIAGNOSIS — O10913 Unspecified pre-existing hypertension complicating pregnancy, third trimester: Secondary | ICD-10-CM | POA: Diagnosis present

## 2017-02-11 DIAGNOSIS — Z3A38 38 weeks gestation of pregnancy: Secondary | ICD-10-CM | POA: Diagnosis not present

## 2017-02-11 DIAGNOSIS — O10919 Unspecified pre-existing hypertension complicating pregnancy, unspecified trimester: Secondary | ICD-10-CM

## 2017-02-15 ENCOUNTER — Encounter (HOSPITAL_COMMUNITY): Payer: Self-pay

## 2017-02-15 ENCOUNTER — Inpatient Hospital Stay (HOSPITAL_COMMUNITY): Payer: Medicaid Other | Admitting: Anesthesiology

## 2017-02-15 ENCOUNTER — Inpatient Hospital Stay (HOSPITAL_COMMUNITY)
Admission: AD | Admit: 2017-02-15 | Discharge: 2017-02-17 | DRG: 774 | Disposition: A | Discharge status: Home / Self Care | Payer: Medicaid Other | Source: Ambulatory Visit | Attending: Obstetrics & Gynecology | Admitting: Obstetrics & Gynecology

## 2017-02-15 DIAGNOSIS — Z6841 Body Mass Index (BMI) 40.0 and over, adult: Secondary | ICD-10-CM | POA: Diagnosis not present

## 2017-02-15 DIAGNOSIS — O4292 Full-term premature rupture of membranes, unspecified as to length of time between rupture and onset of labor: Secondary | ICD-10-CM | POA: Diagnosis present

## 2017-02-15 DIAGNOSIS — O1002 Pre-existing essential hypertension complicating childbirth: Principal | ICD-10-CM | POA: Diagnosis present

## 2017-02-15 DIAGNOSIS — O99214 Obesity complicating childbirth: Secondary | ICD-10-CM | POA: Diagnosis present

## 2017-02-15 DIAGNOSIS — Z3A38 38 weeks gestation of pregnancy: Secondary | ICD-10-CM

## 2017-02-15 DIAGNOSIS — Z7982 Long term (current) use of aspirin: Secondary | ICD-10-CM

## 2017-02-15 DIAGNOSIS — O26893 Other specified pregnancy related conditions, third trimester: Secondary | ICD-10-CM | POA: Diagnosis present

## 2017-02-15 LAB — COMPREHENSIVE METABOLIC PANEL
ALK PHOS: 135 U/L — AB (ref 38–126)
ALT: 12 U/L — ABNORMAL LOW (ref 14–54)
AST: 16 U/L (ref 15–41)
Albumin: 2.9 g/dL — ABNORMAL LOW (ref 3.5–5.0)
Anion gap: 7 (ref 5–15)
CALCIUM: 8.8 mg/dL — AB (ref 8.9–10.3)
CHLORIDE: 105 mmol/L (ref 101–111)
CO2: 23 mmol/L (ref 22–32)
CREATININE: 0.48 mg/dL (ref 0.44–1.00)
GFR calc Af Amer: >60 mL/min (ref >60)
Glucose, Bld: 79 mg/dL (ref 65–99)
Potassium: 3.5 mmol/L (ref 3.5–5.1)
Sodium: 135 mmol/L (ref 135–145)
Total Bilirubin: 0.2 mg/dL — ABNORMAL LOW (ref 0.3–1.2)
Total Protein: 6.5 g/dL (ref 6.5–8.1)

## 2017-02-15 LAB — TYPE AND SCREEN
ABO/RH(D): O POS
Antibody Screen: NEGATIVE

## 2017-02-15 LAB — CBC
HCT: 32.6 % — ABNORMAL LOW (ref 36.0–46.0)
HCT: 31.7 % — ABNORMAL LOW (ref 36.0–46.0)
Hemoglobin: 11.1 g/dL — ABNORMAL LOW (ref 12.0–15.0)
Hemoglobin: 10.9 g/dL — ABNORMAL LOW (ref 12.0–15.0)
MCH: 27.4 pg (ref 26.0–34.0)
MCH: 27.9 pg (ref 26.0–34.0)
MCHC: 34 g/dL (ref 30.0–36.0)
MCHC: 34.4 g/dL (ref 30.0–36.0)
MCV: 80.5 fL (ref 78.0–100.0)
MCV: 81.3 fL (ref 78.0–100.0)
PLATELETS: 280 10*3/uL (ref 150–400)
Platelets: 299 10*3/uL (ref 150–400)
RBC: 4.05 MIL/uL (ref 3.87–5.11)
RBC: 3.9 MIL/uL (ref 3.87–5.11)
RDW: 13.8 % (ref 11.5–15.5)
RDW: 13.9 % (ref 11.5–15.5)
WBC: 11.4 10*3/uL — ABNORMAL HIGH (ref 4.0–10.5)
WBC: 11.6 10*3/uL — AB (ref 4.0–10.5)

## 2017-02-15 LAB — POCT FERN TEST: POCT FERN TEST: POSITIVE

## 2017-02-15 LAB — ABO/RH: ABO/RH(D): O POS

## 2017-02-15 LAB — PROTEIN / CREATININE RATIO, URINE
CREATININE, URINE: 114 mg/dL
Protein Creatinine Ratio: 0.16 mg/mg{Cre} — ABNORMAL HIGH (ref 0.00–0.15)
Total Protein, Urine: 18 mg/dL

## 2017-02-15 MED ORDER — BENZOCAINE-MENTHOL 20-0.5 % EX AERO: 1.0000 "application " | INHALATION_SPRAY | CUTANEOUS | Status: DC | PRN

## 2017-02-15 MED ORDER — FENTANYL 2.5 MCG/ML BUPIVACAINE 1/10 % EPIDURAL INFUSION (WH - ANES)
14.0000 mL/h | INTRAMUSCULAR | Status: DC | PRN
  Administered 2017-02-15: 14 mL/h via EPIDURAL
  Filled 2017-02-15: qty 100

## 2017-02-15 MED ORDER — PHENYLEPHRINE 40 MCG/ML (10ML) SYRINGE FOR IV PUSH (FOR BLOOD PRESSURE SUPPORT)
80.0000 ug | PREFILLED_SYRINGE | INTRAVENOUS | Status: DC | PRN
  Filled 2017-02-15: qty 10
  Filled 2017-02-15: qty 5

## 2017-02-15 MED ORDER — PRENATAL MULTIVITAMIN CH
1.0000 | ORAL_TABLET | Freq: Every day | ORAL | Status: DC
  Administered 2017-02-16 – 2017-02-17 (×2): 1 via ORAL
  Filled 2017-02-15 (×2): qty 1

## 2017-02-15 MED ORDER — FLEET ENEMA 7-19 GM/118ML RE ENEM: 1.0000 | ENEMA | RECTAL | Status: DC | PRN

## 2017-02-15 MED ORDER — SENNOSIDES-DOCUSATE SODIUM 8.6-50 MG PO TABS
2.0000 | ORAL_TABLET | ORAL | Status: DC
  Administered 2017-02-16 – 2017-02-17 (×2): 2 via ORAL
  Filled 2017-02-15 (×2): qty 2

## 2017-02-15 MED ORDER — EPHEDRINE 5 MG/ML INJ
10.0000 mg | INTRAVENOUS | Status: DC | PRN
  Filled 2017-02-15: qty 2

## 2017-02-15 MED ORDER — ONDANSETRON HCL 4 MG/2ML IJ SOLN: 4.0000 mg | INTRAMUSCULAR | Status: DC | PRN

## 2017-02-15 MED ORDER — SIMETHICONE 80 MG PO CHEW: 80.0000 mg | CHEWABLE_TABLET | ORAL | Status: DC | PRN

## 2017-02-15 MED ORDER — OXYCODONE-ACETAMINOPHEN 5-325 MG PO TABS: 1.0000 | ORAL_TABLET | ORAL | Status: DC | PRN

## 2017-02-15 MED ORDER — OXYTOCIN 40 UNITS IN LACTATED RINGERS INFUSION - SIMPLE MED: 2.5000 [IU]/h | INTRAVENOUS | Status: DC

## 2017-02-15 MED ORDER — LACTATED RINGERS IV SOLN
INTRAVENOUS | Status: DC
  Administered 2017-02-15: 500 mL via INTRAUTERINE

## 2017-02-15 MED ORDER — OXYCODONE-ACETAMINOPHEN 5-325 MG PO TABS: 2.0000 | ORAL_TABLET | ORAL | Status: DC | PRN

## 2017-02-15 MED ORDER — LIDOCAINE HCL (PF) 1 % IJ SOLN
30.0000 mL | INTRAMUSCULAR | Status: DC | PRN
  Filled 2017-02-15: qty 30

## 2017-02-15 MED ORDER — COCONUT OIL OIL: 1.0000 "application " | TOPICAL_OIL | Status: DC | PRN

## 2017-02-15 MED ORDER — TETANUS-DIPHTH-ACELL PERTUSSIS 5-2.5-18.5 LF-MCG/0.5 IM SUSP: 0.5000 mL | Freq: Once | INTRAMUSCULAR | Status: DC

## 2017-02-15 MED ORDER — OXYTOCIN 40 UNITS IN LACTATED RINGERS INFUSION - SIMPLE MED
1.0000 m[IU]/min | INTRAVENOUS | Status: DC
  Administered 2017-02-15: 1 m[IU]/min via INTRAVENOUS
  Filled 2017-02-15: qty 1000

## 2017-02-15 MED ORDER — PHENYLEPHRINE 40 MCG/ML (10ML) SYRINGE FOR IV PUSH (FOR BLOOD PRESSURE SUPPORT)
80.0000 ug | PREFILLED_SYRINGE | INTRAVENOUS | Status: DC | PRN
  Filled 2017-02-15: qty 5

## 2017-02-15 MED ORDER — LACTATED RINGERS IV SOLN: 500.0000 mL | Freq: Once | INTRAVENOUS | Status: DC

## 2017-02-15 MED ORDER — DIPHENHYDRAMINE HCL 50 MG/ML IJ SOLN: 12.5000 mg | INTRAMUSCULAR | Status: DC | PRN

## 2017-02-15 MED ORDER — ACETAMINOPHEN 325 MG PO TABS
650.0000 mg | ORAL_TABLET | ORAL | Status: DC | PRN
  Administered 2017-02-15: 650 mg via ORAL
  Filled 2017-02-15: qty 2

## 2017-02-15 MED ORDER — DIBUCAINE 1 % RE OINT: 1.0000 "application " | TOPICAL_OINTMENT | RECTAL | Status: DC | PRN

## 2017-02-15 MED ORDER — WITCH HAZEL-GLYCERIN EX PADS: 1.0000 "application " | MEDICATED_PAD | CUTANEOUS | Status: DC | PRN

## 2017-02-15 MED ORDER — DIPHENHYDRAMINE HCL 25 MG PO CAPS: 25.0000 mg | ORAL_CAPSULE | Freq: Four times a day (QID) | ORAL | Status: DC | PRN

## 2017-02-15 MED ORDER — LACTATED RINGERS IV SOLN
INTRAVENOUS | Status: DC
  Administered 2017-02-15: 12:00:00 via INTRAVENOUS

## 2017-02-15 MED ORDER — FENTANYL CITRATE (PF) 100 MCG/2ML IJ SOLN
100.0000 ug | INTRAMUSCULAR | Status: DC | PRN
  Administered 2017-02-15 (×2): 100 ug via INTRAVENOUS
  Filled 2017-02-15 (×2): qty 2

## 2017-02-15 MED ORDER — LACTATED RINGERS IV SOLN: 500.0000 mL | INTRAVENOUS | Status: DC | PRN

## 2017-02-15 MED ORDER — LIDOCAINE HCL (PF) 1 % IJ SOLN
INTRAMUSCULAR | Status: DC | PRN
  Administered 2017-02-15: 3 mL via EPIDURAL
  Administered 2017-02-15: 2 mL via EPIDURAL
  Administered 2017-02-15: 5 mL via EPIDURAL

## 2017-02-15 MED ORDER — ZOLPIDEM TARTRATE 5 MG PO TABS: 5.0000 mg | ORAL_TABLET | Freq: Every evening | ORAL | Status: DC | PRN

## 2017-02-15 MED ORDER — IBUPROFEN 600 MG PO TABS
600.0000 mg | ORAL_TABLET | Freq: Four times a day (QID) | ORAL | Status: DC
  Administered 2017-02-15 – 2017-02-17 (×8): 600 mg via ORAL
  Filled 2017-02-15 (×8): qty 1

## 2017-02-15 MED ORDER — OXYTOCIN BOLUS FROM INFUSION
500.0000 mL | Freq: Once | INTRAVENOUS | Status: AC
  Administered 2017-02-15: 500 mL via INTRAVENOUS

## 2017-02-15 MED ORDER — SOD CITRATE-CITRIC ACID 500-334 MG/5ML PO SOLN: 30.0000 mL | ORAL | Status: DC | PRN

## 2017-02-15 MED ORDER — ONDANSETRON HCL 4 MG PO TABS: 4.0000 mg | ORAL_TABLET | ORAL | Status: DC | PRN

## 2017-02-15 MED ORDER — LACTATED RINGERS IV SOLN
500.0000 mL | Freq: Once | INTRAVENOUS | Status: AC
  Administered 2017-02-15: 500 mL via INTRAVENOUS

## 2017-02-15 MED ORDER — ONDANSETRON HCL 4 MG/2ML IJ SOLN: 4.0000 mg | Freq: Four times a day (QID) | INTRAMUSCULAR | Status: DC | PRN

## 2017-02-15 MED ORDER — TERBUTALINE SULFATE 1 MG/ML IJ SOLN
0.2500 mg | Freq: Once | INTRAMUSCULAR | Status: DC | PRN
  Filled 2017-02-15: qty 1

## 2017-02-15 MED ORDER — ACETAMINOPHEN 325 MG PO TABS: 650.0000 mg | ORAL_TABLET | ORAL | Status: DC | PRN

## 2017-02-15 NOTE — H&P (Signed)
Christina Gilbert is a 21 y.o. female presenting for leaking fluid.  Admit note: Christina Gilbert  is a 21 y.o. y.o. year old G9P2002 female at [redacted]w[redacted]d weeks gestation who presents to MAU reporting leaking of clear fluid since 0300.   Vtx by informal BS [place].   OB History    Gravida Para Term Preterm AB Living   3 2 2     2    SAB TAB Ectopic Multiple Live Births           2     Past Medical History:  Diagnosis Date  . Asthma    last used inhaler at age 18  . Non Hodgkin's lymphoma ([place])    at age 28  . NVD (normal vaginal delivery)    x2  . Pregnancy induced hypertension    Past Surgical History:  Procedure Laterality Date  . LYMPH GLAND EXCISION    . TONSILLECTOMY     Family History: family history includes Diabetes in her father and mother; Hypertension in her father and mother. Social History:  reports that she has never smoked. She has never used smokeless tobacco. She reports that she does not drink alcohol. Her drug history is not on file.     Maternal Diabetes: No Genetic Screening: Normal Maternal Ultrasounds/Referrals: Normal Fetal Ultrasounds or other Referrals:  None Maternal Substance Abuse:  No Significant Maternal Medications:  None Significant Maternal Lab Results:  None Other Comments:  None  Review of Systems  Constitutional: Negative for chills, fever and malaise/fatigue.  Respiratory: Negative for shortness of breath.   Gastrointestinal: Positive for abdominal pain. Negative for constipation, diarrhea, nausea and vomiting.  Neurological: Negative for focal weakness.   Maternal Medical History:  Reason for admission: Rupture of membranes.  Nausea.  Contractions: Onset was 1-2 hours ago.   Frequency: irregular.   Perceived severity is mild.    Fetal activity: Perceived fetal activity is normal.   Last perceived fetal movement was within the past hour.    Prenatal complications: No bleeding, PIH, infection, placental abnormality, pre-eclampsia (Had  preeclampsia in past) or preterm labor.   Prenatal Complications - Diabetes: none.    Dilation: 4 Effacement (%): 90 Station: -3 Exam by:: Tanzania Bowen RN Blood pressure 132/82, pulse (!) 120, temperature 97.6 F (36.4 C), temperature source Oral, resp. rate 20, height 5\' 6"  (1.676 m), weight 255 lb (115.7 kg), last menstrual period 05/09/2016, SpO2 100 %. Maternal Exam:  Uterine Assessment: Contraction strength is mild.  Contraction frequency is irregular.   Abdomen: Patient reports no abdominal tenderness. Fetal presentation: vertex  Introitus: Normal vulva. Normal vagina.  Ferning test: positive.  Nitrazine test: not done. Amniotic fluid character: clear.  Pelvis: adequate for delivery.   Cervix: Cervix evaluated by digital exam.     Fetal Exam Fetal Monitor Review: Mode: ultrasound.   Baseline rate: 140.  Variability: moderate (6-25 bpm).   Pattern: accelerations present and no decelerations.    Fetal State Assessment: Category I - tracings are normal.     Physical Exam  Constitutional: She is oriented to person, place, and time. She appears well-developed and well-nourished. No distress.  HENT:  Head: Normocephalic.  Cardiovascular: Normal rate and regular rhythm.   Respiratory: Effort normal. No respiratory distress.  GI: Soft. She exhibits no distension. There is no tenderness. There is no guarding.  Musculoskeletal: Normal range of motion.  Neurological: She is alert and oriented to person, place, and time.  Skin: Skin is warm and dry.  Psychiatric:  She has a normal mood and affect.    Prenatal labs: ABO, Rh: O/Positive/-- (03/05 0000) Antibody: Negative (03/05 0000) Rubella: Immune (03/05 0000) RPR: Nonreactive (03/05 0000)  HBsAg: Negative (03/05 0000)  HIV: Non-reactive (03/05 0000)  GBS:     Assessment/Plan: SIUP at [redacted]w[redacted]d PROM at term  Admit to [facility] Routine orders Will observe for labor   Hansel Feinstein 02/15/2017, 4:42  AM

## 2017-02-15 NOTE — Progress Notes (Addendum)
Labor Progress Note  Christina Gilbert is a 21 y.o. G3P2002 at [redacted]w[redacted]d  admitted for rupture of membranes  S: has epidural, complaining of some headaches and blurred vision. H/o cHTN only on ASA.   O:  BP 131/71   Pulse 87   Temp 98.6 F (37 C) (Oral)   Resp 20   Ht 5\' 6"  (1.676 m)   Wt 255 lb (115.7 kg)   LMP 05/09/2016 (Approximate)   SpO2 99%   BMI 41.16 kg/m   Total I/O In: -  Out: 200 [Urine:200]  FHT:  FHR: 140 bpm, variability: moderate,  accelerations:  Present,  decelerations:  Present variables UC:   regular, every 3-6 minutes SVE:   Dilation: 6.5 Effacement (%): 90 Station: -2 Exam by:: Carlton Adam, CNM SROM  Labs: Lab Results  Component Value Date   WBC 11.4 (H) 02/15/2017   HGB 11.1 (L) 02/15/2017   HCT 32.6 (L) 02/15/2017   MCV 80.5 02/15/2017   PLT 299 02/15/2017    Assessment / Plan: 21 y.o. [id number] [redacted]w[redacted]d in latent labor Protracted latent phase  Labor: Protracted course of labor, having variables will start amnioinfusion and then start pitocin cHTN:  Ordered preE labs with changes in symptoms, BP remaining stable, no indication for medications at this time Fetal Wellbeing:  Category II Pain Control:  Epidural Anticipated MOD:  NSVD  Expectant management  Luiz Blare, DO 02/15/2017, 1:11 PM

## 2017-02-15 NOTE — Progress Notes (Signed)
Labor Progress Note  Aluna Whiston is a 21 y.o. G3P2002 at [redacted]w[redacted]d  admitted for rupture of membranes  S: has epidural, comfortable without concerns  O:  BP 140/78   Pulse 83   Temp 98 F (36.7 C) (Oral)   Resp 20   Ht 5\' 6"  (1.676 m)   Wt 255 lb (115.7 kg)   LMP 05/09/2016 (Approximate)   SpO2 99%   BMI 41.16 kg/m   Total I/O In: -  Out: 200 [Urine:200]  FHT:  FHR: 130 bpm, variability: moderate,  accelerations:  Present,  decelerations:  Present variables UC:   regular, every 3-6 minutes SVE:   Dilation: 5 Effacement (%): 80 Station: -2 Exam by:: Philis Pique, RN SROM  Labs: Lab Results  Component Value Date   WBC 11.4 (H) 02/15/2017   HGB 11.1 (L) 02/15/2017   HCT 32.6 (L) 02/15/2017   MCV 80.5 02/15/2017   PLT 299 02/15/2017    Assessment / Plan: 21 y.o. G3P2002 [redacted]w[redacted]d in early labor Spontaneous labor, progressing normally  Labor: Progressing normally, will see how she progresses at next cervix check, Ctxs are spacing out,  may need to start pitocin Fetal Wellbeing:  Category II Pain Control:  Epidural Anticipated MOD:  NSVD  Expectant management  Luiz Blare, DO 02/15/2017, 11:01 AM

## 2017-02-15 NOTE — Anesthesia Pain Management Evaluation Note (Signed)
  CRNA Pain Management Visit Note  Patient: Christina Gilbert, 21 y.o., female  "Hello I am a member of the anesthesia team at [facility]. We have an anesthesia team available at all times to provide care throughout the hospital, including epidural management and anesthesia for C-section. I don't know your plan for the delivery whether it a natural birth, water birth, IV sedation, nitrous supplementation, doula or epidural, but we want to meet your pain goals."   1.Was your pain managed to your expectations on prior hospitalizations?   Yes   2.What is your expectation for pain management during this hospitalization?     Epidural  3.How can we help you reach that goal? epidural  Record the patient's initial score and the patient's pain goal.   Pain: 9  Pain Goal: 2 [facility] wants you to be able to say your pain was always managed very well.  Manpreet Strey 02/15/2017

## 2017-02-15 NOTE — Progress Notes (Signed)
Patient ID: Christina Gilbert, female   DOB: [birth year], 21 y.o.   MRN: [id number] Wants epidural  Vitals:   02/15/17 0340 02/15/17 0443  BP: 132/82 135/79  Pulse: (!) 120 89  Resp: 20 16  Temp: 97.6 F (36.4 C)   TempSrc: Oral   SpO2: 100%   Weight: 255 lb (115.7 kg) 255 lb (115.7 kg)  Height: 5\' 6"  (1.676 m) 5\' 6"  (1.676 m)   FHR reassuring UCs not tracing well, spaced out  Dilation: 5 Effacement (%): 80 Cervical Position: Middle Station: -2 Presentation: Vertex Exam by:: L.Stubbs, RN  May need Pitocin

## 2017-02-15 NOTE — Anesthesia Preprocedure Evaluation (Signed)
Anesthesia Evaluation  Patient identified by MRN, date of birth, ID band Patient awake    Reviewed: Allergy & Precautions, NPO status , Patient's Chart, lab work & pertinent test results  Airway Mallampati: II  TM Distance: >3 FB Neck ROM: Full    Dental  (+) Teeth Intact, Dental Advisory Given   Pulmonary asthma ,    Pulmonary exam normal breath sounds clear to auscultation       Cardiovascular hypertension, Normal cardiovascular exam Rhythm:Regular Rate:Normal     Neuro/Psych negative neurological ROS  negative psych ROS   GI/Hepatic negative GI ROS, Neg liver ROS,   Endo/Other  Morbid obesity  Renal/GU negative Renal ROS     Musculoskeletal negative musculoskeletal ROS (+)   Abdominal   Peds  Hematology  (+) Blood dyscrasia, anemia , Plt 211k  Non Hodgkin's lymphoma as child   Anesthesia Other Findings Day of surgery medications reviewed with the patient.  Reproductive/Obstetrics (+) Pregnancy PIH on aspirin.  No anti-hypertensive medications                             Anesthesia Physical Anesthesia Plan  ASA: III  Anesthesia Plan: Epidural   Post-op Pain Management:    Induction:   PONV Risk Score and Plan:   Airway Management Planned:   Additional Equipment:   Intra-op Plan:   Post-operative Plan:   Informed Consent: I have reviewed the patients History and Physical, chart, labs and discussed the procedure including the risks, benefits and alternatives for the proposed anesthesia with the patient or authorized representative who has indicated his/her understanding and acceptance.   Dental advisory given  Plan Discussed with:   Anesthesia Plan Comments: (Patient identified. Risks/Benefits/Options discussed with patient including but not limited to bleeding, infection, nerve damage, paralysis, failed block, incomplete pain control, headache, blood pressure changes,  nausea, vomiting, reactions to medication both or allergic, itching and postpartum back pain. Confirmed with bedside nurse the patient's most recent platelet count. Confirmed with patient that they are not currently taking any anticoagulation, have any bleeding history or any family history of bleeding disorders. Patient expressed understanding and wished to proceed. All questions were answered. )        Anesthesia Quick Evaluation

## 2017-02-15 NOTE — Anesthesia Procedure Notes (Signed)
Epidural Patient location during procedure: OB Start time: 02/15/2017 7:57 AM End time: 02/15/2017 8:01 AM  Staffing Anesthesiologist: Catalina Gravel Performed: anesthesiologist   Preanesthetic Checklist Completed: patient identified, pre-op evaluation, timeout performed, IV checked, risks and benefits discussed and monitors and equipment checked  Epidural Patient position: sitting Prep: DuraPrep Patient monitoring: blood pressure and continuous pulse ox Approach: midline Location: L3-L4 Injection technique: LOR air  Needle:  Needle type: Tuohy  Needle gauge: 17 G Needle length: 9 cm Needle insertion depth: 7 cm Catheter size: 19 Gauge Catheter at skin depth: 12 cm Test dose: negative and Other (1% Lidocaine)  Additional Notes Patient identified.  Risk benefits discussed including failed block, incomplete pain control, headache, nerve damage, paralysis, blood pressure changes, nausea, vomiting, reactions to medication both toxic or allergic, and postpartum back pain.  Patient expressed understanding and wished to proceed.  All questions were answered.  Sterile technique used throughout procedure and epidural site dressed with sterile barrier dressing. No paresthesia or other complications noted. The patient did not experience any signs of intravascular injection such as tinnitus or metallic taste in mouth nor signs of intrathecal spread such as rapid motor block. Please see nursing notes for vital signs. Reason for block:procedure for pain

## 2017-02-15 NOTE — MAU Provider Note (Signed)
Chief Complaint  Patient presents with  . Rupture of Membranes    First Provider Initiated Contact with Patient 02/15/17 0425     Annelie Boak  is a 21 y.o. y.o. year old G78P2002 female at [redacted]w[redacted]d weeks gestation who presents to MAU reporting leaking of clear fluid since 0300.   Vtx by informal BS [place].   Tamala Julian, [place], [place] 02/15/2017 4:15 AM

## 2017-02-15 NOTE — MAU Note (Signed)
Pt here with c/o rupture of membranes at 0300; pressure. Reports good fetal movement. GBS negative.

## 2017-02-15 NOTE — Anesthesia Postprocedure Evaluation (Signed)
Anesthesia Post Note  Patient: Sports administrator  Procedure(s) Performed: * No procedures listed *     Patient location during evaluation: Mother Baby Anesthesia Type: Epidural Level of consciousness: awake and alert and oriented Pain management: pain level controlled Vital Signs Assessment: post-procedure vital signs reviewed and stable Respiratory status: spontaneous breathing and nonlabored ventilation Cardiovascular status: stable Postop Assessment: no headache, no backache, patient able to bend at knees, epidural receding, no signs of nausea or vomiting and adequate PO intake Anesthetic complications: no    Last Vitals:  Vitals:   02/15/17 1616 02/15/17 1704  BP: (!) 142/79 (!) 146/67  Pulse: 86 89  Resp: 20 18  Temp: 37.1 C 36.9 C    Last Pain:  Vitals:   02/15/17 1704  TempSrc: Oral  PainSc:    Pain Goal: Patients Stated Pain Goal: 2 (02/15/17 0736)               Jabier Mutton

## 2017-02-16 LAB — RPR: RPR: NONREACTIVE

## 2017-02-16 NOTE — Progress Notes (Signed)
POSTPARTUM PROGRESS NOTE  Post Partum Day 1 Subjective:  Christina Gilbert is a 21 y.o. G3P3003 [redacted]w[redacted]d s/p nsvd.  No acute events overnight.  Pt denies problems with ambulating, voiding or po intake.  She denies nausea or vomiting.  Pain is well controlled.  She has had flatus. She has not had bowel movement.  Lochia Small. No ha, vision change, upper abd pain, sob.  Objective: Blood pressure 137/83, pulse 70, temperature 97.7 F (36.5 C), temperature source Oral, resp. rate 18, height 5\' 6"  (1.676 m), weight 255 lb (115.7 kg), last menstrual period 05/09/2016, SpO2 100 %, unknown if currently breastfeeding.  Physical Exam:  General: alert, cooperative and no distress Lochia:normal flow Chest: CTAB Heart: RRR no m/r/g Abdomen: +BS, soft, nontender,  Uterine Fundus: firm,  DVT Evaluation: No calf swelling or tenderness Extremities: trace edema   Recent Labs  02/15/17 0508 02/15/17 1319  HGB 11.1* 10.9*  HCT 32.6* 31.7*    Assessment/Plan:  ASSESSMENT: Christina Gilbert is a 21 y.o. G3P3003 [redacted]w[redacted]d s/p nsvd, doing well. No s/s preeclampsia, bps wnl to mildly elevated.  Plan for discharge tomorrow   LOS: 1 day   Desma Maxim 02/16/2017, 11:30 AM

## 2017-02-16 NOTE — Progress Notes (Signed)
UR chart review completed.  

## 2017-02-17 MED ORDER — IBUPROFEN 600 MG PO TABS: 600.0000 mg | ORAL_TABLET | Freq: Four times a day (QID) | ORAL | 0 refills | Status: DC

## 2017-02-17 MED ORDER — SENNOSIDES-DOCUSATE SODIUM 8.6-50 MG PO TABS: 1.0000 | ORAL_TABLET | Freq: Every evening | ORAL | 0 refills | Status: DC | PRN

## 2017-02-17 NOTE — Discharge Instructions (Signed)

## 2017-02-17 NOTE — Discharge Summary (Signed)
OB Discharge Summary     Patient Name: Christina Gilbert DOB: 07-10-1996 MRN: [id number]  Date of admission: 02/15/2017 Delivering MD: Katheren Shams   Date of discharge: 02/17/2017  Admitting diagnosis: 101 WEEKS ROM PRESSURE CTX Intrauterine pregnancy: [redacted]w[redacted]d     Secondary diagnosis:  Active Problems:   Indication for care in labor or delivery  Additional problems:  Patient Active Problem List   Diagnosis Date Noted  . Indication for care in labor or delivery 02/15/2017  . Trichomonas contact 11/06/2016  . Chronic hypertension affecting pregnancy 09/13/2015  . Susceptible to varicella (non-immune), currently pregnant 03/25/2015  . Asthma, well controlled 06/15/2013  . Obesity affecting pregnancy in second trimester 06/12/2013  . 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 yo P1W2585 at [redacted]w[redacted]d was admitted in Latent Labor with SROM on 02/15/2017. Patient had an uncomplicated labor course as follows:  Membrane Rupture Time/Date: 3:00 AM ,02/15/2017   Intrapartum Procedures: Episiotomy: None [1]                                         Lacerations:  None [1]  Patient had a delivery of a Viable infant. 02/15/2017  Information for the patient's newborn:  Linzee, Depaul Girl Abbygail [[id number]]  Delivery Method: Vaginal, Spontaneous Delivery (Filed from Delivery Summary)    Patient had an uncomplicated postpartum course. She is ambulating, tolerating a regular diet, passing flatus, has had a bowel movement, and urinating well. Patient is discharged home in stable condition on 02/17/17.   Physical exam  Vitals:   02/16/17 0556 02/16/17 1732 02/16/17 1735 02/17/17 0540  BP: 137/83 (!) 150/100 138/81  133/83  Pulse: 70 80 93 81  Resp: 18 19  17   Temp: 97.7 F (36.5 C) 98.1 F (36.7 C)  98.2 F (36.8 C)  TempSrc: Oral Oral  Oral  SpO2:      Weight:      Height:       General: alert, cooperative and no distress Lochia: appropriate Uterine Fundus: firm Incision: N/A DVT Evaluation: No evidence of DVT seen on physical exam. Negative Homan's sign. No significant calf/ankle edema. Labs: Lab Results  Component Value Date   WBC 11.6 (H) 02/15/2017   HGB 10.9 (L) 02/15/2017   HCT 31.7 (L) 02/15/2017   MCV 81.3 02/15/2017   PLT 280 02/15/2017   CMP Latest Ref Rng & Units 02/15/2017  Glucose 65 - 99 mg/dL 79  BUN 6 - 20 mg/dL <5(L)  Creatinine 0.44 - 1.00 mg/dL 0.48  Sodium 135 - 145 mmol/L 135  Potassium 3.5 - 5.1 mmol/L 3.5  Chloride 101 - 111 mmol/L 105  CO2  22 - 32 mmol/L 23  Calcium 8.9 - 10.3 mg/dL 8.8(L)  Total Protein 6.5 - 8.1 g/dL 6.5  Total Bilirubin 0.3 - 1.2 mg/dL 0.2(L)  Alkaline Phos 38 - 126 U/L 135(H)  AST 15 - 41 U/L 16  ALT 14 - 54 U/L 12(L)    Discharge instruction: per After Visit Summary and "Baby and Me Booklet".  After visit meds:  Allergies as of 02/17/2017   No Known Allergies     Medication List    STOP taking these medications   acetaminophen 500 MG tablet Commonly known as:  TYLENOL   promethazine 12.5 MG tablet Commonly known as:  PHENERGAN     TAKE these medications   aspirin EC 81 MG tablet Take 1 tablet (81 mg total) by mouth daily.   ibuprofen 600 MG tablet Commonly known as:  ADVIL,MOTRIN Take 1 tablet (600 mg total) by mouth every 6 (six) hours.   prenatal multivitamin Tabs tablet Take 1 tablet by mouth daily at 12 noon.   ranitidine 75 MG tablet Commonly known as:  ZANTAC Take 75 mg by mouth 3 (three) times daily.   senna-docusate 8.6-50 MG tablet Commonly known as:  Senokot-S Take 1 tablet by mouth at bedtime as needed for mild constipation.       Diet: routine diet  Activity: Advance as tolerated. Pelvic  rest for 6 weeks.   Outpatient follow up:1 week for BP check and then 4-6 weeks for postpartum visit Follow up Appt:No future appointments. Follow up Visit:No Follow-up on file.  Postpartum contraception: IUD    Newborn Data: Live born female  Birth Weight: 6 lb 0.8 oz (2745 g) APGAR: 9, 9  Baby Feeding: Bottle Disposition:home with mother   02/17/2017 [place] Shirley, DO   CNM attestation I have seen and examined this patient and agree with above documentation in the resident's note.   Christina Gilbert is a 21 y.o. [id number] s/p SVD.   Pain is well controlled.  Plan for birth control is IUD.  Method of Feeding: bottle  PE:  BP 133/83 (BP Location: Right Arm)   Pulse 81   Temp 98.2 F (36.8 C) (Oral)   Resp 17   Ht 5\' 6"  (1.676 m)   Wt 115.7 kg (255 lb)   LMP 05/09/2016 (Approximate)   SpO2 100%   Breastfeeding? Unknown   BMI 41.16 kg/m  Fundus firm   Recent Labs  02/15/17 0508 02/15/17 1319  HGB 11.1* 10.9*  HCT 32.6* 31.7*     Plan: discharge today - postpartum care discussed - f/u clinic in 1wk for BP check, then 4-6 weeks for postpartum visit   Serita Grammes, CNM 11:27 AM  02/17/2017

## 2017-02-17 NOTE — Clinical Social Work Maternal (Signed)
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 [place],  [address]  Phone number:  [phone]   Household Members:  Significant Other, Minor Children (Couple has two other children: Cyan Ohmer, age 3 and Caylea Little, age 1)   Natural Supports (not living in the home):  Parent, Immediate Family, Extended Family (MOB reports that she has a good support system, and "I don't know what I would do without him (FOB) and my mom." )   Professional Supports: None   Employment:     Type of Work: MOB was previously working at [employer].  She does not plan to return after maternity leave, but wants to look for another job.  FOB works for a trucking company in [place].   Education:      Financial Resources:  Medicaid   Other Resources:  Food Stamps  (MOB plans to apply for WIC)   Cultural/Religious Considerations Which May Impact Care: None stated.    Strengths:  Ability to meet basic needs , Pediatrician chosen , Home prepared for child  (Pediatric follow up will be at [facility].)   Risk Factors/Current Problems:  Substance Use    Cognitive State:  Alert , Able to Concentrate , Insightful , Linear Thinking , Goal Oriented    Mood/Affect:  Euthymic , Calm , Comfortable , Interested    CSW Assessment: CSW met with MOB in her first floor room/135 to offer support and complete assessment due to hx of marijuana use in  pregnancy.  MOB was initially on the phone, but welcomed CSW into her room and ended her conversation in order to speak with CSW.  CSW found MOB to be pleasant and easy to engage. MOB reports that she was speaking with her mother because they are trying to figure out a plan for her other two children while baby remains in the hospital on phototherapy.  MOB states that her mother is asking why the baby can't stay in the nursery.  MOB reports that FOB has to go to work tonight and that they do not have any family members who can take off of work to watch the children.  CSW explained baby patient vs nursery patient policies.  MOB was relieved to hear that it is a possibility that baby can be cared for in the nursery, but she was clearly struggling with the decision.  She asked CSW if the staff would think she was a bad mom.  CSW assured her that we will not judge her for making a decision for the best interest of all of her children.  CSW encouraged her to remain in her hospital room with baby for as long as she can today and then commit to remaining involved with baby's hospitalization through calls and visits (as she is able).  MOB thanked CSW and states this is her plan.  CSW informed RN and asked MOB to notify her RN when   she needs to leave and the baby will be taken to the nursery and her room will be turned over for the next patient.  She stated understanding and agreement that she will not be allowed to get her hospital room back after leaving today.   MOB reports that she is feeling well emotionally and denies any hx of mental health concerns.  She was attentive to information given regarding PMADs and agrees to talk with her doctor if concerns arise at any time.  MOB states she is happy about her baby, but does not wish to have any more children any time soon.  She has plans to get an IUD at her 6 week check.  MOB was aware of SIDS precautions as reviewed by CSW and states she has a crib for baby at home.    CSW inquired about MOB's marijuana use and informed her of hospital drug screen policy given her positive screen for THC in pregnancy.  MOB states she smoked marijuana "just a little bit" since her positive UDS in March and estimates that it has been "over a month" since she last used.  She was understanding of policy and states she went through this situation with her other children.  She reports not smoking marijuana outside of pregnancy and used it to treat nausea when pregnant with her daughters and back pain when pregnant with her son.  Baby's UDS is negative.  CSW will monitor CDS results and make report to CPS as indicated.    CSW Plan/Description:  No Further Intervention Required/No Barriers to Discharge, Patient/Family Education     Gaston Dase Elizabeth, LCSW 02/17/2017, 1:18 PM 

## 2017-04-13 DIAGNOSIS — R87612 Low grade squamous intraepithelial lesion on cytologic smear of cervix (LGSIL): Secondary | ICD-10-CM | POA: Insufficient documentation

## 2017-08-11 ENCOUNTER — Ambulatory Visit: Payer: Self-pay | Admitting: Podiatry

## 2017-08-12 ENCOUNTER — Ambulatory Visit (INDEPENDENT_AMBULATORY_CARE_PROVIDER_SITE_OTHER): Payer: Medicaid Other | Admitting: Podiatry

## 2017-08-12 ENCOUNTER — Encounter: Payer: Self-pay | Admitting: Podiatry

## 2017-08-12 VITALS — BP 130/78 | HR 82 | Resp 18

## 2017-08-12 DIAGNOSIS — M2142 Flat foot [pes planus] (acquired), left foot: Principal | ICD-10-CM

## 2017-08-12 DIAGNOSIS — M2141 Flat foot [pes planus] (acquired), right foot: Secondary | ICD-10-CM

## 2017-08-12 DIAGNOSIS — Q828 Other specified congenital malformations of skin: Secondary | ICD-10-CM

## 2017-08-12 MED ORDER — DICLOFENAC SODIUM 1 % TD GEL: 2.0000 g | Freq: Four times a day (QID) | TRANSDERMAL | 2 refills | Status: DC

## 2017-08-15 NOTE — Progress Notes (Signed)
Subjective:   Patient ID: Christina Gilbert, female   DOB: 22 y.o.   MRN: [id number]   HPI Christina Gilbert presents the office today for concerns of painful skin lesions, calluses to both of her feet.  She states that they have been getting worse as she has been working on hard surfaces.  Is currently working for [employer] in environmental services.  This is been worsening over the last month.  She denies any swelling or redness and she denies any drainage.  No recent injury or trauma.  She states that she has flat feet as well.  She said no recent treatment.  No other concerns.   Review of Systems  All other systems reviewed and are negative.       Objective:  Physical Exam  General: AAO x3, NAD  Dermatological: Hyperkeratotic lesions bilateral medial hallux, left submetatarsal two area as well as left heel.  Upon debridement there is no underlying ulceration, drainage or any clinical signs of infection noted.  There is no other open lesions or pre-ulcerative lesions identified today.  Vascular: Dorsalis Pedis artery and Posterior Tibial artery pedal pulses are 2/4 bilateral with immedate capillary fill time. Pedal hair growth present.There is no pain with calf compression, swelling, warmth, erythema.   Neruologic: Grossly intact via light touch bilateral.Protective threshold with Semmes Wienstein monofilament intact to all pedal sites bilateral.  Musculoskeletal: There is a decrease in medial arch upon weightbearing.  No pain, crepitus, or limitation noted with foot and ankle range of motion bilateral. Muscular strength 5/5 in all groups tested bilateral.  Gait: Unassisted, Nonantalgic.       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  Tolerated well -We discussed orthotics to help take pressure off of her feet.  She is instructed in over-the-counter insert.  We also discussed the change in shoes.  Offloading. -Follow-up with her needed.  Call any questions or concerns.  Should no further questions or concerns today.  Christina Gilbert DPM

## 2018-01-20 ENCOUNTER — Ambulatory Visit: Payer: Medicaid Other | Admitting: Podiatry

## 2018-01-20 ENCOUNTER — Ambulatory Visit (INDEPENDENT_AMBULATORY_CARE_PROVIDER_SITE_OTHER): Payer: Medicaid Other

## 2018-01-20 DIAGNOSIS — M2141 Flat foot [pes planus] (acquired), right foot: Secondary | ICD-10-CM

## 2018-01-20 DIAGNOSIS — M21619 Bunion of unspecified foot: Secondary | ICD-10-CM

## 2018-01-20 DIAGNOSIS — L989 Disorder of the skin and subcutaneous tissue, unspecified: Secondary | ICD-10-CM

## 2018-01-20 DIAGNOSIS — M2142 Flat foot [pes planus] (acquired), left foot: Secondary | ICD-10-CM | POA: Diagnosis not present

## 2018-01-23 NOTE — Progress Notes (Signed)
Subjective: 22 year old female presents the office today for concerns of recurrent painful calluses to both of her feet.  She was getting a pedicure she was told that the areas to be surgically removed she wants to further evaluate his options.  She states she has ongoing pain in the calluses come back very quickly.  She denies any redness or drainage or any swelling.  She did try orthotics but they were not helpful. Denies any systemic complaints such as fevers, chills, nausea, vomiting. No acute changes since last appointment, and no other complaints at this time.   Objective: AAO x3, NAD DP/PT pulses palpable bilaterally, CRT less than 3 seconds There is a decrease in medial arch upon weightbearing bilaterally.  HAV is present.  There is tenderness on hyperkeratotic lesions noted to bilateral medial hallux as well as left submetatarsal 5.  Upon debridement there is no underlying ulceration, drainage or any signs of infection noted today.  No open lesions or pre-ulcerative lesions.  No pain with calf compression, swelling, warmth, erythema  Assessment: Symptomatic hyperkeratotic lesions bilaterally  Plan: -All treatment options discussed with the patient including all alternatives, risks, complications.  -X-rays were obtained and reviewed.  HAV is identified.  Skin markers for the last identified.  Skin lesions.  No bony exostosis of foreign body or calcifications present. -We discussed treatment options.  I did as a courtesy to debride the hyperkeratotic lesions without any complications or bleeding.  In regards to surgical options discussed we cannot just cut out the calluses as a likely return.  Could consider doing bunion surgery or shaking on the bone underneath the fifth metatarsal head.  However I discussed that doing surgery for this may not fix the issue and may need to other issues.  She will consider her options. -Patient encouraged to call the office with any questions, concerns, change  in symptoms.   Trula Slade DPM

## 2018-11-24 ENCOUNTER — Encounter: Payer: Self-pay | Admitting: Podiatry

## 2018-11-24 ENCOUNTER — Ambulatory Visit: Payer: Medicaid Other | Admitting: Podiatry

## 2018-11-24 ENCOUNTER — Other Ambulatory Visit: Payer: Self-pay

## 2018-11-24 VITALS — Temp 97.9°F

## 2018-11-24 DIAGNOSIS — L600 Ingrowing nail: Secondary | ICD-10-CM

## 2018-11-24 DIAGNOSIS — D492 Neoplasm of unspecified behavior of bone, soft tissue, and skin: Secondary | ICD-10-CM | POA: Diagnosis not present

## 2018-11-24 MED ORDER — CEPHALEXIN 500 MG PO CAPS: 500.0000 mg | ORAL_CAPSULE | Freq: Three times a day (TID) | ORAL | 0 refills | Status: DC

## 2018-11-24 NOTE — Patient Instructions (Addendum)

## 2018-11-28 ENCOUNTER — Encounter: Payer: Self-pay | Admitting: *Deleted

## 2018-11-28 ENCOUNTER — Telehealth: Payer: Self-pay | Admitting: Podiatry

## 2018-11-28 NOTE — Telephone Encounter (Signed)
Pt needs a Dr. Radene Ou for Employer stating that she can work, with her having the ingrown toenail sx done on this past Thursday. She needs it to say no restrictions.

## 2018-11-28 NOTE — Telephone Encounter (Signed)
I called pt and she states she needs a note stating she can work without restrictions, faxed to [employer] [phone], and email to [email or web address]. Faxed letter to Occupational [employer], and [place]. [place] emailed to pt.

## 2018-11-28 NOTE — Telephone Encounter (Signed)
Please call patient

## 2018-11-29 NOTE — Progress Notes (Signed)
Subjective: 23 year old female presents the office with concerns about ingrown toenail to the right big toe, medial aspect.  She states the area is painful with pressure in shoes.  This is been an ongoing issue for her and is been getting worse.  She denies any drainage or pus coming from the area.  She also has multiple calluses that she is wanted to be trimmed today's are causing significant discomfort.  She also has bunions to become painful at times.  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 Denies any systemic complaints such as fevers, chills, nausea, vomiting. No acute changes since last appointment, and no other complaints at this time.   Objective: AAO x3, NAD DP/PT pulses palpable bilaterally, CRT less than 3 seconds Bony deformities present with flatfoot.  Hyperkeratotic lesions present on the medial hallux, submetatarsal 5.  Upon debridement no underlying ulceration drainage or any signs of infection. Incurvation present on the medial aspect the right hallux toenail with tenderness to palpation.  There is localized edema but there is no erythema or warmth.  There is no ascending cellulitis.  There is no fluctuation crepitation or any malodor.  Overall the nail is hypertrophic, dystrophic with brown discoloration. No open lesions or pre-ulcerative lesions.  No pain with calf compression, swelling, warmth, erythema  Assessment: 23 year old female symptomatic right medial hallux ingrown toenail, callus formation  Plan: -All treatment options discussed with the patient including all alternatives, risks, complications.  -At this time, the patient is requesting partial nail removal with chemical matricectomy to the symptomatic portion of the nail. Risks and complications were discussed with the patient for which they understand and written consent was obtained. Under sterile conditions a total of 3 mL of a mixture of 2% lidocaine plain and 0.5% Marcaine plain  was infiltrated in a hallux block fashion. Once anesthetized, the skin was prepped in sterile fashion. A tourniquet was then applied. Next the medial aspect of hallux nail border was then sharply excised making sure to remove the entire offending nail border. Once the nails were ensured to be removed area was debrided and the underlying skin was intact. There is no purulence identified in the procedure. Next phenol was then applied under standard conditions and copiously irrigated. Silvadene was applied. A dry sterile dressing was applied. After application of the dressing the tourniquet was removed and there is found to be an immediate capillary refill time to the digit. The patient tolerated the procedure well any complications. Post procedure instructions were discussed the patient for which he verbally understood. Follow-up in one week for nail check or sooner if any problems are to arise. Discussed signs/symptoms of infection and directed to call the office immediately should any occur or go directly to the emergency room. In the meantime, encouraged to call the office with any questions, concerns, changes symptoms. -As a courtesy I debrided the hyperkeratotic lesions with any location only. -Patient encouraged to call the office with any questions, concerns, change in symptoms.   Trula Slade DPM

## 2018-12-01 ENCOUNTER — Encounter: Payer: Self-pay | Admitting: *Deleted

## 2018-12-08 ENCOUNTER — Ambulatory Visit: Payer: Medicaid Other | Admitting: Podiatry

## 2018-12-20 ENCOUNTER — Ambulatory Visit: Payer: Medicaid Other | Admitting: Podiatry

## 2019-02-03 ENCOUNTER — Other Ambulatory Visit: Payer: Self-pay

## 2019-02-03 ENCOUNTER — Encounter (HOSPITAL_BASED_OUTPATIENT_CLINIC_OR_DEPARTMENT_OTHER): Payer: Self-pay | Admitting: *Deleted

## 2019-02-03 ENCOUNTER — Emergency Department (HOSPITAL_BASED_OUTPATIENT_CLINIC_OR_DEPARTMENT_OTHER)
Admission: EM | Admit: 2019-02-03 | Discharge: 2019-02-03 | Disposition: A | Discharge status: Home / Self Care | Payer: Medicaid Other | Attending: Emergency Medicine | Admitting: Emergency Medicine

## 2019-02-03 DIAGNOSIS — K121 Other forms of stomatitis: Secondary | ICD-10-CM

## 2019-02-03 DIAGNOSIS — Z7982 Long term (current) use of aspirin: Secondary | ICD-10-CM | POA: Diagnosis not present

## 2019-02-03 DIAGNOSIS — K1230 Oral mucositis (ulcerative), unspecified: Secondary | ICD-10-CM | POA: Diagnosis not present

## 2019-02-03 DIAGNOSIS — Z79899 Other long term (current) drug therapy: Secondary | ICD-10-CM | POA: Diagnosis not present

## 2019-02-03 DIAGNOSIS — K1379 Other lesions of oral mucosa: Secondary | ICD-10-CM | POA: Diagnosis present

## 2019-02-03 MED ORDER — ACYCLOVIR 200 MG PO CAPS: 200.0000 mg | ORAL_CAPSULE | Freq: Every day | ORAL | 0 refills | Status: DC

## 2019-02-03 MED FILL — ACYCLOVIR 200 MG CAP: 200 | 35 days supply | Qty: 35 | Fill #0

## 2019-02-03 NOTE — ED Provider Notes (Signed)
[facility] EMERGENCY DEPARTMENT Provider Note   CSN: [id number] Arrival date & time: 02/03/19  1527     History   Chief Complaint Chief Complaint  Patient presents with  . Mouth Lesions    HPI Christina Gilbert is a 23 y.o. female.     Patient with history of asthma, vaginal delivery history presents with oral lesions new today.  Patient did have oral sex recently with a new partner.  Patient denies any vaginal symptoms.  No fevers or chills.  No significant symptoms with them.  No history of similar     Past Medical History:  Diagnosis Date  . Asthma    last used inhaler at age 73  . Non Hodgkin's lymphoma ([place])    at age 42  . NVD (normal vaginal delivery)    x2  . Pregnancy induced hypertension     Patient Active Problem List   Diagnosis Date Noted  . 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 Morbid obesity ([place]) 03/30/2017  . Indication for care in labor or delivery 02/15/2017  . Trichomonas contact 11/06/2016  . Chronic hypertension affecting pregnancy 09/13/2015  . Susceptible to varicella (non-immune), currently pregnant 03/25/2015  . Asthma, well controlled 06/15/2013  . Obesity affecting pregnancy in second trimester 06/12/2013  . 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 LYMPH GLAND EXCISION    . TONSILLECTOMY       OB History    Gravida  3   Para  3   Term  3   Preterm      AB      Living  3     SAB      TAB      Ectopic      Multiple  0   Live Births  3            Home Medications    Prior to Admission medications   Medication Sig Start Date End Date Taking? Authorizing Provider  acyclovir (ZOVIRAX) 200 MG capsule Take 1 capsule (200 mg total) by mouth 5 (five) times daily. 02/03/19   Elnora Morrison, MD  aspirin EC 81 MG tablet Take 1 tablet (81 mg total) by mouth daily. 08/25/16   Mumaw, Lauralyn Primes, DO  cephALEXin (KEFLEX) 500 MG capsule Take 1  capsule (500 mg total) by mouth 3 (three) times daily. 11/24/18   Trula Slade, DPM  diclofenac sodium (VOLTAREN) 1 % GEL Apply 2 g topically 4 (four) times daily. Rub into affected area of foot 2 to 4 times daily 08/12/17   Trula Slade, DPM  ibuprofen (ADVIL,MOTRIN) 600 MG tablet Take 1 tablet (600 mg total) by mouth every 6 (six) hours. 02/17/17   Shirley, [place], DO  Prenatal Vit-Fe Fumarate-FA (PRENATAL MULTIVITAMIN) TABS tablet Take 1 tablet by mouth daily at 12 noon.    [provider]  ranitidine (ZANTAC) 75 MG tablet Take 75 mg by mouth 3 (three) times daily.    [provider]  senna-docusate (SENOKOT-S) 8.6-50 MG tablet Take 1 tablet by mouth at bedtime as needed for mild constipation. 02/17/17   Shirley, [place], DO    Family History Family History  Problem Relation Age of Onset  . Diabetes Mother   . Hypertension Mother   . Diabetes Father   . Hypertension Father     Social History Social History   Tobacco Use  . Smoking status: Never Smoker  .  Smokeless tobacco: Never Used  Substance Use Topics  . Alcohol use: No  . Drug use: Not on file     Allergies   Patient has no known allergies.   Review of Systems Review of Systems  Constitutional: Negative for chills and fever.  Gastrointestinal: Negative for abdominal pain and vomiting.  Genitourinary: Negative for dysuria.  Musculoskeletal: Negative for back pain, neck pain and neck stiffness.  Skin: Positive for rash.  Neurological: Negative for light-headedness and headaches.     Physical Exam Updated Vital Signs BP (!) 144/73   Pulse 90   Temp 98.7 F (37.1 C)   Resp 18   Ht 5\' 6"  (1.676 m)   Wt 111.1 kg   SpO2 100%   BMI 39.54 kg/m   Physical Exam Vitals signs and nursing note reviewed.  Constitutional:      Appearance: She is well-developed.  HENT:     Head: Normocephalic and atraumatic.     Comments: Patient has a few oral ulcers posterior pharynx worse on patient's  left.  No drainage.  No trismus.  No tongue swelling.  No abscess. Eyes:     General:        Right eye: No discharge.        Left eye: No discharge.  Neck:     Trachea: No tracheal deviation.  Cardiovascular:     Rate and Rhythm: Normal rate.  Pulmonary:     Effort: Pulmonary effort is normal.  Skin:    General: Skin is warm.     Findings: No rash.  Neurological:     Mental Status: She is alert and oriented to person, place, and time.      ED Treatments / Results  Labs (all labs ordered are listed, but only abnormal results are displayed) Labs Reviewed - No data to display  EKG None  Radiology No results found.  Procedures Procedures (including critical care time)  Medications Ordered in ED Medications - No data to display   Initial Impression / Assessment and Plan / ED Course  I have reviewed the triage vital signs and the nursing notes.  Pertinent labs & imaging results that were available during my care of the patient were reviewed by me and considered in my medical decision making (see chart for details).       Patient presents with new oral ulcers.  Discussed differential diagnosis and plan for acyclovir and follow-up with primary doctor.  Discussed no oral sex until lesions cleared or at least 1 week.  Final Clinical Impressions(s) / ED Diagnoses   Final diagnoses:  Mouth ulcers    ED Discharge Orders         Ordered    acyclovir (ZOVIRAX) 200 MG capsule  5 times daily     02/03/19 1653           Elnora Morrison, MD 02/03/19 1656

## 2019-02-03 NOTE — Discharge Instructions (Addendum)
Follow-up with your local doctor for further testing and assessment. Use treatment for 1 week. Avoid oral sex and kissing if you have any lesions and at least for 1 week.

## 2019-02-03 NOTE — ED Triage Notes (Signed)
Pt c/o infection in mouth from sucking her thumb x 2 days ago

## 2019-02-15 ENCOUNTER — Other Ambulatory Visit: Payer: Self-pay

## 2019-02-15 DIAGNOSIS — Z20822 Contact with and (suspected) exposure to covid-19: Secondary | ICD-10-CM

## 2019-02-19 LAB — NOVEL CORONAVIRUS, NAA: SARS-CoV-2, NAA: NOT DETECTED

## 2019-04-12 ENCOUNTER — Encounter: Payer: Self-pay | Admitting: Physician Assistant

## 2019-04-12 ENCOUNTER — Emergency Department (HOSPITAL_BASED_OUTPATIENT_CLINIC_OR_DEPARTMENT_OTHER)
Admission: EM | Admit: 2019-04-12 | Discharge: 2019-04-12 | Disposition: A | Discharge status: Home / Self Care | Payer: Medicaid Other | Attending: Emergency Medicine | Admitting: Emergency Medicine

## 2019-04-12 ENCOUNTER — Encounter (HOSPITAL_BASED_OUTPATIENT_CLINIC_OR_DEPARTMENT_OTHER): Payer: Self-pay

## 2019-04-12 ENCOUNTER — Other Ambulatory Visit: Payer: Self-pay

## 2019-04-12 DIAGNOSIS — R112 Nausea with vomiting, unspecified: Secondary | ICD-10-CM | POA: Insufficient documentation

## 2019-04-12 DIAGNOSIS — R197 Diarrhea, unspecified: Secondary | ICD-10-CM | POA: Diagnosis not present

## 2019-04-12 DIAGNOSIS — Z79899 Other long term (current) drug therapy: Secondary | ICD-10-CM | POA: Diagnosis not present

## 2019-04-12 DIAGNOSIS — J45909 Unspecified asthma, uncomplicated: Secondary | ICD-10-CM | POA: Diagnosis not present

## 2019-04-12 LAB — CBC WITH DIFFERENTIAL/PLATELET
Abs Immature Granulocytes: 0.02 10*3/uL (ref 0.00–0.07)
Basophils Absolute: 0.1 10*3/uL (ref 0.0–0.1)
Basophils Relative: 1 %
Eosinophils Absolute: 0.2 10*3/uL (ref 0.0–0.5)
Eosinophils Relative: 2 %
HCT: 47.5 % — ABNORMAL HIGH (ref 36.0–46.0)
Hemoglobin: 15.2 g/dL — ABNORMAL HIGH (ref 12.0–15.0)
Immature Granulocytes: 0 %
Lymphocytes Relative: 40 %
Lymphs Abs: 3.4 10*3/uL (ref 0.7–4.0)
MCH: 28.6 pg (ref 26.0–34.0)
MCHC: 32 g/dL (ref 30.0–36.0)
MCV: 89.3 fL (ref 80.0–100.0)
Monocytes Absolute: 0.7 10*3/uL (ref 0.1–1.0)
Monocytes Relative: 8 %
Neutro Abs: 4 10*3/uL (ref 1.7–7.7)
Neutrophils Relative %: 49 %
Platelets: 353 10*3/uL (ref 150–400)
RBC: 5.32 MIL/uL — ABNORMAL HIGH (ref 3.87–5.11)
RDW: 13.2 % (ref 11.5–15.5)
WBC: 8.4 10*3/uL (ref 4.0–10.5)
nRBC: 0 % (ref 0.0–0.2)

## 2019-04-12 LAB — COMPREHENSIVE METABOLIC PANEL
ALT: 43 U/L (ref 0–44)
AST: 98 U/L — ABNORMAL HIGH (ref 15–41)
Albumin: 4.1 g/dL (ref 3.5–5.0)
Alkaline Phosphatase: 82 U/L (ref 38–126)
Anion gap: 11 (ref 5–15)
BUN: 11 mg/dL (ref 6–20)
CO2: 27 mmol/L (ref 22–32)
Calcium: 9.1 mg/dL (ref 8.9–10.3)
Chloride: 97 mmol/L — ABNORMAL LOW (ref 98–111)
Creatinine, Ser: 0.66 mg/dL (ref 0.44–1.00)
GFR calc Af Amer: >60 mL/min (ref >60)
GFR calc non Af Amer: >60 mL/min (ref >60)
Glucose, Bld: 123 mg/dL — ABNORMAL HIGH (ref 70–99)
Potassium: 3.5 mmol/L (ref 3.5–5.1)
Sodium: 135 mmol/L (ref 135–145)
Total Bilirubin: 0.3 mg/dL (ref 0.3–1.2)
Total Protein: 7.4 g/dL (ref 6.5–8.1)

## 2019-04-12 LAB — URINALYSIS, ROUTINE W REFLEX MICROSCOPIC
Bilirubin Urine: NEGATIVE
Glucose, UA: NEGATIVE mg/dL
Hgb urine dipstick: NEGATIVE
Ketones, ur: 15 mg/dL — AB
Nitrite: NEGATIVE
Protein, ur: NEGATIVE mg/dL
Specific Gravity, Urine: 1.02 (ref 1.005–1.030)
pH: 7.5 (ref 5.0–8.0)

## 2019-04-12 LAB — URINALYSIS, MICROSCOPIC (REFLEX)

## 2019-04-12 LAB — PREGNANCY, URINE: Preg Test, Ur: NEGATIVE

## 2019-04-12 LAB — LIPASE, BLOOD: Lipase: 28 U/L (ref 11–51)

## 2019-04-12 MED ORDER — DICYCLOMINE HCL 20 MG PO TABS: 20.0000 mg | ORAL_TABLET | Freq: Two times a day (BID) | ORAL | 0 refills | Status: DC

## 2019-04-12 MED ORDER — ONDANSETRON HCL 4 MG/2ML IJ SOLN
4.0000 mg | Freq: Once | INTRAMUSCULAR | Status: AC
  Administered 2019-04-12: 04:00:00 4 mg via INTRAVENOUS
  Filled 2019-04-12: qty 2

## 2019-04-12 MED ORDER — DICYCLOMINE HCL 10 MG PO CAPS
10.0000 mg | ORAL_CAPSULE | Freq: Once | ORAL | Status: AC
  Administered 2019-04-12: 10 mg via ORAL
  Filled 2019-04-12: qty 1

## 2019-04-12 MED ORDER — LOPERAMIDE HCL 2 MG PO CAPS: 2.0000 mg | ORAL_CAPSULE | Freq: Four times a day (QID) | ORAL | 0 refills | Status: DC | PRN

## 2019-04-12 MED ORDER — ONDANSETRON 4 MG PO TBDP: 4.0000 mg | ORAL_TABLET | Freq: Three times a day (TID) | ORAL | 0 refills | Status: DC | PRN

## 2019-04-12 MED ORDER — SODIUM CHLORIDE 0.9 % IV BOLUS
1000.0000 mL | Freq: Once | INTRAVENOUS | Status: AC
  Administered 2019-04-12: 04:00:00 1000 mL via INTRAVENOUS

## 2019-04-12 NOTE — ED Notes (Signed)
Pt provided water and sprite for PO challenge. Pt ambulatory to BR to attempt stool sample.

## 2019-04-12 NOTE — ED Notes (Signed)
ED Provider at bedside. 

## 2019-04-12 NOTE — Discharge Instructions (Addendum)
You were seen today for nausea, vomiting, diarrhea, abdominal cramping.  Your work-up is reassuring.  This is most likely a viral illness.  If you are able to provide a stool sample, C. difficile testing is pending.  Make sure to stay hydrated.  Take medications as prescribed.

## 2019-04-12 NOTE — ED Triage Notes (Signed)
Pt presents with upper quad abdominal pain and diarrhea x 1 week. 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 Pt denies fevers.

## 2019-04-12 NOTE — ED Notes (Signed)
Pt ambulatory to BR- provided urine sample. Pt unable to provide stool at this time.

## 2019-04-12 NOTE — ED Provider Notes (Signed)
[facility] EMERGENCY DEPARTMENT Provider Note   CSN: [id number] Arrival date & time: 04/12/19  [id number]     History   Chief Complaint Chief Complaint  Patient presents with  . Abdominal Pain    HPI Christina Gilbert is a 23 y.o. female.     HPI  This is a 23 year old female with a history of non-Hodgkin's lymphoma who presents with nausea, vomiting, abdominal pain, diarrhea.  Patient reports 1 week history of the symptoms.  She reports nonbilious, nonbloody emesis and nonbloody diarrhea.  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 difficile positive patient.  Patient reports sharp crampy abdominal pain that is nonradiating.  It is in the upper abdomen.  Nothing seems to make it better or worse.  Pain got worse tonight which made her seek treatment.  She has not taken anything for her symptoms.  She denies any fevers, cough, chest pain, shortness of breath.  Past Medical History:  Diagnosis Date  . Asthma    last used inhaler at age 80  . Non Hodgkin's lymphoma ([place])    at age 66  . NVD (normal vaginal delivery)    x2  . Pregnancy induced hypertension     Patient Active Problem List   Diagnosis Date Noted  . 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 Morbid obesity ([place]) 03/30/2017  . Indication for care in labor or delivery 02/15/2017  . Trichomonas contact 11/06/2016  . Chronic hypertension affecting pregnancy 09/13/2015  . Susceptible to varicella (non-immune), currently pregnant 03/25/2015  . Asthma, well controlled 06/15/2013  . Obesity affecting pregnancy in second trimester 06/12/2013  . 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 LYMPH GLAND EXCISION    . TONSILLECTOMY       OB History    Gravida  3   Para  3   Term  3   Preterm      AB      Living  3     SAB      TAB      Ectopic      Multiple  0   Live Births  3            Home Medications    Prior to Admission  medications   Medication Sig Start Date End Date Taking? Authorizing Provider  acyclovir (ZOVIRAX) 200 MG capsule Take 1 capsule (200 mg total) by mouth 5 (five) times daily. 02/03/19   Elnora Morrison, MD  aspirin EC 81 MG tablet Take 1 tablet (81 mg total) by mouth daily. 08/25/16   Mumaw, Lauralyn Primes, DO  cephALEXin (KEFLEX) 500 MG capsule Take 1 capsule (500 mg total) by mouth 3 (three) times daily. 11/24/18   Trula Slade, DPM  diclofenac sodium (VOLTAREN) 1 % GEL Apply 2 g topically 4 (four) times daily. Rub into affected area of foot 2 to 4 times daily 08/12/17   Trula Slade, DPM  dicyclomine (BENTYL) 20 MG tablet Take 1 tablet (20 mg total) by mouth 2 (two) times daily. 04/12/19   Franciszek Platten, Barbette Hair, MD  ibuprofen (ADVIL,MOTRIN) 600 MG tablet Take 1 tablet (600 mg total) by mouth every 6 (six) hours. 02/17/17   Shirley, [place], DO  loperamide (IMODIUM) 2 MG capsule Take 1 capsule (2 mg total) by mouth 4 (four) times daily as needed for diarrhea or loose stools. 04/12/19   Michalle Rademaker, Barbette Hair, MD  ondansetron (  ZOFRAN ODT) 4 MG disintegrating tablet Take 1 tablet (4 mg total) by mouth every 8 (eight) hours as needed. 04/12/19   Gustav Knueppel, Barbette Hair, MD  Prenatal Vit-Fe Fumarate-FA (PRENATAL MULTIVITAMIN) TABS tablet Take 1 tablet by mouth daily at 12 noon.    [provider]  ranitidine (ZANTAC) 75 MG tablet Take 75 mg by mouth 3 (three) times daily.    [provider]  senna-docusate (SENOKOT-S) 8.6-50 MG tablet Take 1 tablet by mouth at bedtime as needed for mild constipation. 02/17/17   Shirley, [place], DO    Family History Family History  Problem Relation Age of Onset  . Diabetes Mother   . Hypertension Mother   . Diabetes Father   . Hypertension Father     Social History Social History   Tobacco Use  . Smoking status: Never Smoker  . Smokeless tobacco: Never Used  Substance Use Topics  . Alcohol use: No  . Drug use: Never     Allergies   Patient  has no known allergies.   Review of Systems Review of Systems  Respiratory: Negative for shortness of breath.   Cardiovascular: Negative for chest pain.  Gastrointestinal: Positive for abdominal pain, diarrhea, nausea and vomiting.  Genitourinary: Negative for dysuria.  Musculoskeletal: Negative for back pain.  Neurological: Negative for dizziness.  All other systems reviewed and are negative.    Physical Exam Updated Vital Signs BP 109/76 (BP Location: Right Arm)   Pulse 85   Temp 98.3 F (36.8 C) (Oral)   Resp 17   Ht 1.676 m (5\' 6" )   Wt 104.3 kg   SpO2 99%   BMI 37.12 kg/m   Physical Exam Vitals signs and nursing note reviewed.  Constitutional:      Appearance: She is well-developed. She is obese. She is not ill-appearing.  HENT:     Head: Normocephalic and atraumatic.  Neck:     Musculoskeletal: Neck supple.  Cardiovascular:     Rate and Rhythm: Normal rate and regular rhythm.     Heart sounds: Normal heart sounds.  Pulmonary:     Effort: Pulmonary effort is normal. No respiratory distress.     Breath sounds: No wheezing.  Abdominal:     General: Abdomen is flat. Bowel sounds are normal.     Palpations: Abdomen is soft.     Tenderness: There is generalized abdominal tenderness. There is no guarding or rebound.  Musculoskeletal:     Right lower leg: No edema.     Left lower leg: No edema.  Skin:    General: Skin is warm and dry.  Neurological:     Mental Status: She is alert and oriented to person, place, and time.  Psychiatric:        Mood and Affect: Mood normal.      ED Treatments / Results  Labs (all labs ordered are listed, but only abnormal results are displayed) Labs Reviewed  CBC WITH DIFFERENTIAL/PLATELET - Abnormal; Notable for the following components:      Result Value   RBC 5.32 (*)    Hemoglobin 15.2 (*)    HCT 47.5 (*)    All other components within normal limits  COMPREHENSIVE METABOLIC PANEL - Abnormal; Notable for the  following components:   Chloride 97 (*)    Glucose, Bld 123 (*)    AST 98 (*)    All other components within normal limits  URINALYSIS, ROUTINE W REFLEX MICROSCOPIC - Abnormal; Notable for the following components:  APPearance HAZY (*)    Ketones, ur 15 (*)    Leukocytes,Ua SMALL (*)    All other components within normal limits  URINALYSIS, MICROSCOPIC (REFLEX) - Abnormal; Notable for the following components:   Bacteria, UA FEW (*)    All other components within normal limits  C DIFFICILE QUICK SCREEN W PCR REFLEX  LIPASE, BLOOD  PREGNANCY, URINE    EKG None  Radiology No results found.  Procedures Procedures (including critical care time)  Medications Ordered in ED Medications  ondansetron (ZOFRAN) injection 4 mg (4 mg Intravenous Given 04/12/19 0408)  sodium chloride 0.9 % bolus 1,000 mL (0 mLs Intravenous Stopped 04/12/19 0454)  dicyclomine (BENTYL) capsule 10 mg (10 mg Oral Given 04/12/19 0402)     Initial Impression / Assessment and Plan / ED Course  I have reviewed the triage vital signs and the nursing notes.  Pertinent labs & imaging results that were available during my care of the patient were reviewed by me and considered in my medical decision making (see chart for details).        Patient presents with nausea, vomiting, diarrhea, crampy abdominal pain.  She is overall nontoxic and vital signs are reassuring.  She is afebrile.  She has diffuse tenderness but no signs of peritonitis.  She appears well-hydrated.  Patient was given fluids, Zofran, and Bentyl.  Lab work reviewed and largely reassuring.  Slightly elevated AST but this is in isolation.  On recheck, patient is able to tolerate fluids.  She has yet to provide a stool sample.  She has a normal white count which would argue against acute C. difficile infection; however, if she was able to provide a sample, will discharge home awaiting results.  Recommend supportive measures in the meantime with Imodium,  Bentyl, and Zofran.  After history, exam, and medical workup I feel the patient has been appropriately medically screened and is safe for discharge home. Pertinent diagnoses were discussed with the patient. Patient was given return precautions.   Final Clinical Impressions(s) / ED Diagnoses   Final diagnoses:  Nausea vomiting and diarrhea    ED Discharge Orders         Ordered    dicyclomine (BENTYL) 20 MG tablet  2 times daily     04/12/19 0455    ondansetron (ZOFRAN ODT) 4 MG disintegrating tablet  Every 8 hours PRN     04/12/19 0455    loperamide (IMODIUM) 2 MG capsule  4 times daily PRN     04/12/19 0455           Merryl Hacker, MD 04/12/19 0500

## 2019-04-19 ENCOUNTER — Other Ambulatory Visit: Payer: Self-pay

## 2019-04-19 ENCOUNTER — Encounter: Payer: Self-pay | Admitting: Physician Assistant

## 2019-04-19 ENCOUNTER — Ambulatory Visit (INDEPENDENT_AMBULATORY_CARE_PROVIDER_SITE_OTHER): Payer: Medicaid Other | Admitting: Physician Assistant

## 2019-04-19 VITALS — BP 122/70 | Temp 98.8°F | Ht 66.0 in | Wt 222.0 lb

## 2019-04-19 DIAGNOSIS — G8929 Other chronic pain: Secondary | ICD-10-CM

## 2019-04-19 DIAGNOSIS — R1013 Epigastric pain: Secondary | ICD-10-CM

## 2019-04-19 DIAGNOSIS — R194 Change in bowel habit: Secondary | ICD-10-CM | POA: Diagnosis not present

## 2019-04-19 DIAGNOSIS — R112 Nausea with vomiting, unspecified: Secondary | ICD-10-CM | POA: Diagnosis not present

## 2019-04-19 MED ORDER — OMEPRAZOLE 40 MG PO CPDR: DELAYED_RELEASE_CAPSULE | ORAL | 2 refills | Status: DC

## 2019-04-19 NOTE — Patient Instructions (Signed)
If you are age 24 or older, your body mass index should be between 23-30. Your Body mass index is 35.83 kg/m. If this is out of the aforementioned range listed, please consider follow up with your Primary Care Provider.  If you are age 90 or younger, your body mass index should be between 19-25. Your Body mass index is 35.83 kg/m. If this is out of the aformentioned range listed, please consider follow up with your Primary Care Provider.   We have sent the following medications to your pharmacy for you to pick up at your convenience: Omeprazole   Start Align- once daily.Samples given today.   A high fiber diet with plenty of fluids (up to 8 glasses of water daily) is suggested to relieve these symptoms.  Metamucil, 1 tablespoon once or twice daily can be used to keep bowels regular if needed.  Drink plenty of water and exercise daily.   Thank you for choosing me and [facility].  Dennison Bulla

## 2019-04-19 NOTE — Progress Notes (Signed)
Chief Complaint: Epigastric pain  HPI:    Christina Gilbert is a 23 year old female with a past medical history of non-Hodgkin's lymphoma as well as others listed below, who was referred to me by [facility], [facility] for a complaint of epigastric pain.      04/12/2019 ED visit for nausea, vomiting, abdominal pain and diarrhea.  Patient reported this occurring for a week.  CBC with a hemoglobin of 15.2, CMP with an AST elevated 98.  C. difficile ordered but patient did not produce a sample, lipase normal, pregnancy in the urine negative.  She was given fluids, Zofran and Bentyl.  She was able to tolerate fluids.    Today, the patient tells me that she has really had these problems for the last 2 years off-and-on.  Describes an epigastric pain with reflux and "cramping".  She tried the Bentyl as prescribed but this did not help.  This is typically worse after eating things such as pizza or drinking "Petrone" or having a beer.  Patient has been on probiotics off and on which she feels like helps some.  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    Also describes bowel habits which radiate back and forth from constipation to diarrhea.  The diarrhea does not over last very long, usually a couple of days.  She has not tried anything for this in the past and wonders if it could be IBS.    Denies fever, chills, blood in her stool, weight loss, further vomiting or symptoms that awaken her from sleep.  Past Medical History:  Diagnosis Date  . Asthma    last used inhaler at age 25  . Non Hodgkin's lymphoma ([place])    at age 68  . NVD (normal vaginal delivery)    x2  . Pregnancy induced hypertension     Past Surgical History:  Procedure Laterality Date  . LYMPH GLAND EXCISION    . TONSILLECTOMY      Current Outpatient Medications  Medication Sig Dispense Refill  .  acyclovir (ZOVIRAX) 200 MG capsule Take 1 capsule (200 mg total) by mouth 5 (five) times daily. 35 capsule 0  . aspirin EC 81 MG tablet Take 1 tablet (81 mg total) by mouth daily. 90 tablet 4  . cephALEXin (KEFLEX) 500 MG capsule Take 1 capsule (500 mg total) by mouth 3 (three) times daily. 21 capsule 0  . diclofenac sodium (VOLTAREN) 1 % GEL Apply 2 g topically 4 (four) times daily. Rub into affected area of foot 2 to 4 times daily 100 g 2  . dicyclomine (BENTYL) 20 MG tablet Take 1 tablet (20 mg total) by mouth 2 (two) times daily. 20 tablet 0  . ibuprofen (ADVIL,MOTRIN) 600 MG tablet Take 1 tablet (600 mg total) by mouth every 6 (six) hours. 30 tablet 0  . loperamide (IMODIUM) 2 MG capsule Take 1 capsule (2 mg total) by mouth 4 (four) times daily as needed for diarrhea or loose stools. 12 capsule 0  . ondansetron (ZOFRAN ODT) 4 MG disintegrating tablet Take 1 tablet (4 mg total) by mouth every 8 (eight) hours as needed. 20 tablet 0  . Prenatal Vit-Fe Fumarate-FA (PRENATAL MULTIVITAMIN) TABS tablet Take 1 tablet by mouth daily at 12 noon.    . ranitidine (ZANTAC) 75 MG tablet Take 75 mg by  mouth 3 (three) times daily.    Marland Kitchen senna-docusate (SENOKOT-S) 8.6-50 MG tablet Take 1 tablet by mouth at bedtime as needed for mild constipation. 30 tablet 0   No current facility-administered medications for this visit.     Allergies as of 04/19/2019  . (No Known Allergies)    Family History  Problem Relation Age of Onset  . Diabetes Mother   . Hypertension Mother   . Diabetes Father   . Hypertension Father     Social History   Socioeconomic History  . Marital status: Single    Spouse name: Not on file  . Number of children: Not on file  . Years of education: Not on file  . Highest education level: Not on file  Occupational History  . Not on file  Social Needs  . Financial resource strain: Not on file  . Food insecurity    Worry: Not on file    Inability: Not on file  . Transportation  needs    Medical: Not on file    Non-medical: Not on file  Tobacco Use  . Smoking status: Never Smoker  . Smokeless tobacco: Never Used  Substance and Sexual Activity  . Alcohol use: No  . Drug use: Never  . Sexual activity: Not on file  Lifestyle  . Physical activity    Days per week: Not on file    Minutes per session: Not on file  . Stress: Not on file  Relationships  . Social Herbalist on phone: Not on file    Gets together: Not on file    Attends religious service: Not on file    Active member of club or organization: Not on file    Attends meetings of clubs or organizations: Not on file    Relationship status: Not on file  . Intimate partner violence    Fear of current or ex partner: Not on file    Emotionally abused: Not on file    Physically abused: Not on file    Forced sexual activity: Not on file  Other Topics Concern  . Not on file  Social History Narrative  . Not on file    Review of Systems:    Constitutional: No weight loss, fever or chills Skin: No rash  Cardiovascular: No chest pain Respiratory: No SOB Gastrointestinal: See HPI and otherwise negative Genitourinary: No dysuria  Neurological: No headache, dizziness or syncope Musculoskeletal: No new muscle or joint pain Hematologic: No bleeding  Psychiatric: No history of depression or anxiety   Physical Exam:  Vital signs: BP 122/70   Temp 98.8 F (37.1 C)   Ht 5\' 6"  (1.676 m)   Wt 222 lb (100.7 kg)   BMI 35.83 kg/m   Constitutional:   Pleasant AA female appears to be in NAD, Well developed, Well nourished, alert and cooperative Head:  Normocephalic and atraumatic. Eyes:   PEERL, EOMI. No icterus. Conjunctiva pink. Ears:  Normal auditory acuity. Neck:  Supple Throat: Oral cavity and pharynx without inflammation, swelling or lesion.  Respiratory: Respirations even and unlabored. Lungs clear to auscultation bilaterally.   No wheezes, crackles, or rhonchi.  Cardiovascular: Normal  S1, S2. No MRG. Regular rate and rhythm. No peripheral edema, cyanosis or pallor.  Gastrointestinal:  Soft, nondistended, mild epigastric ttp. No rebound or guarding. Normal bowel sounds. No appreciable masses or hepatomegaly. Rectal:  Not performed.  Msk:  Symmetrical without gross deformities. Without edema, no deformity or joint abnormality.  Neurologic:  Alert and  oriented x4;  grossly normal neurologically.  Skin:   Dry and intact without significant lesions or rashes. Psychiatric:  Demonstrates good judgement and reason without abnormal affect or behaviors.  RELEVANT LABS AND IMAGING: CBC    Component Value Date/Time   WBC 8.4 04/12/2019 0407   RBC 5.32 (H) 04/12/2019 0407   HGB 15.2 (H) 04/12/2019 0407   HCT 47.5 (H) 04/12/2019 0407   PLT 353 04/12/2019 0407   MCV 89.3 04/12/2019 0407   MCH 28.6 04/12/2019 0407   MCHC 32.0 04/12/2019 0407   RDW 13.2 04/12/2019 0407   LYMPHSABS 3.4 04/12/2019 0407   MONOABS 0.7 04/12/2019 0407   EOSABS 0.2 04/12/2019 0407   BASOSABS 0.1 04/12/2019 0407    CMP     Component Value Date/Time   NA 135 04/12/2019 0407   K 3.5 04/12/2019 0407   CL 97 (L) 04/12/2019 0407   CO2 27 04/12/2019 0407   GLUCOSE 123 (H) 04/12/2019 0407   BUN 11 04/12/2019 0407   CREATININE 0.66 04/12/2019 0407   CALCIUM 9.1 04/12/2019 0407   PROT 7.4 04/12/2019 0407   ALBUMIN 4.1 04/12/2019 0407   AST 98 (H) 04/12/2019 0407   ALT 43 04/12/2019 0407   ALKPHOS 82 04/12/2019 0407   BILITOT 0.3 04/12/2019 0407   GFRNONAA >60 04/12/2019 0407   GFRAA >60 04/12/2019 0407    Assessment: 1.  Epigastric pain: Chronic for the patient over the past 2 years with reflux and some nausea, related to eating; most likely gastritis+/-H. Pylori+/-PUD 2.  GERD: With above 3.  Alternating bowel habits: Alternates from constipation to diarrhea; discussed IBS  Plan: 1.  Discussed antireflux diet and lifestyle modifications with patient.  This includes abstaining from alcohol  or decrease in alcohol intake. 2.  Prescribed Omeprazole 40 mg daily, 30-60 minutes before breakfast #30 with 3 refills.  Did discuss with patient if she does not feel better after taking this for 2 weeks then could increase it to twice daily dosing for a period of time to see if this helps. 3.  Recommend the patient increase fiber in her diet with a fiber supplement.  Also discussed increased water of 6-8 eight ounce glasses of day per day as well as exercise. 4.  Recommend Align daily probiotic.  Did provide her with samples.  Recommend this for at least a month at a time. 5.  Patient is happy to try these conservative measures first before further testing.  If above measures do not work then could consider an EGD  6.  Patient to follow in clinic with me in 4 to 6 weeks. Assigned to Dr, Hilarie Fredrickson today.  Ellouise Newer, PA-C [facility] 04/19/2019, 10:43 AM  Cc: [place], [place]

## 2019-05-04 ENCOUNTER — Ambulatory Visit: Payer: Medicaid Other | Admitting: Physician Assistant

## 2019-05-04 NOTE — Progress Notes (Signed)
Addendum: Reviewed and agree with assessment and management plan. Quorra Rosene M, MD  

## 2019-06-05 ENCOUNTER — Ambulatory Visit: Payer: Medicaid Other | Admitting: Podiatry

## 2019-06-05 ENCOUNTER — Other Ambulatory Visit: Payer: Self-pay

## 2019-06-05 ENCOUNTER — Encounter: Payer: Self-pay | Admitting: Podiatry

## 2019-06-05 ENCOUNTER — Ambulatory Visit: Payer: Medicaid Other

## 2019-06-05 DIAGNOSIS — M2142 Flat foot [pes planus] (acquired), left foot: Secondary | ICD-10-CM

## 2019-06-05 DIAGNOSIS — M21619 Bunion of unspecified foot: Secondary | ICD-10-CM

## 2019-06-05 DIAGNOSIS — M2141 Flat foot [pes planus] (acquired), right foot: Secondary | ICD-10-CM | POA: Diagnosis not present

## 2019-06-05 DIAGNOSIS — Q828 Other specified congenital malformations of skin: Secondary | ICD-10-CM

## 2019-06-05 DIAGNOSIS — M79672 Pain in left foot: Secondary | ICD-10-CM

## 2019-06-05 DIAGNOSIS — M79671 Pain in right foot: Secondary | ICD-10-CM

## 2019-06-05 DIAGNOSIS — M21621 Bunionette of right foot: Secondary | ICD-10-CM

## 2019-06-05 NOTE — Progress Notes (Signed)
Subjective: 23 year old female presents the office today as a same day appointment for concerns of foot pain.  She states the calluses become very painful and she wants to consider having surgery on her foot.  She has tried offloading, padding significant improvement.  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 Denies any systemic complaints such as fevers, chills, nausea, vomiting. No acute changes since last appointment, and no other complaints at this time.   Objective: AAO x3, NAD DP/PT pulses palpable bilaterally, CRT less than 3 seconds Hyperkeratotic lesions bilateral plantar hallux left submetatarsal 5 plantar heel.  Upon debridement there is no underlying ulceration drainage or any signs of infection.  Moderate bunion deformities present on the left side worse than the right and there is hypermobility present of the first ray.  Mild tailor's bunion present.  Flatfoot present No pain with calf compression, swelling, warmth, erythema  Assessment: 23 year old female with hyperkeratotic lesions likely due to digital deformity, flatfoot  Plan: -All treatment options discussed with the patient including all alternatives, risks, complications.  -I debrided the hyperkeratotic lesions as a courtesy x4 without any complications or bleeding.  Recommend moisturizer daily.  Ultimately discussed surgical intervention.  We discussed with her left foot Lapidus bunionectomy, left fifth metatarsal exostectomy.  We discussed the surgery as well as the postoperative course.  We discussed alternatives, risks, complications.  For now she like to hold off on surgery and consider this after the first of the year.  She was given information for surgery scheduler and should she want to proceed she is to let us know. -Patient encouraged to call the office with any questions, concerns, change in symptoms.   Trula Slade DPM

## 2019-07-10 ENCOUNTER — Telehealth: Payer: Self-pay | Admitting: Physician Assistant

## 2019-07-10 ENCOUNTER — Telehealth: Payer: Self-pay

## 2019-07-10 NOTE — Telephone Encounter (Signed)
Called patient back and she states the epi-gastric abdominal pain she had,  when she came in for an office visit on 04/19/19 is not any better. She is taking the Omeprazole daily, has increased her fiber, and taking align with no improvement. Said no n/v and no bm changes, but everytime she eats she gets the pain in the upper middle of her abdomin

## 2019-07-10 NOTE — Telephone Encounter (Signed)
Recommend scheduling her for EGD with Dr. Hilarie Fredrickson.   Also recommend she increase omeprazole to 40mg  BID until then.   Thanks-JLL

## 2019-07-10 NOTE — Telephone Encounter (Signed)
Left message to please call back. °

## 2019-07-11 NOTE — Telephone Encounter (Signed)
Called patient and let her know Ellouise Newer PA would like her to increase her Omeprazole to twice a day. She is also asking for something else for pain. Please advise. Scheduled for EGD on 07/17/19 with Dr. Hilarie Fredrickson. COVID testing on 07/13/19 at 11:20am at [id number]. Pre-visit on 07/14/19 patient to arrive at 7:50am

## 2019-07-12 ENCOUNTER — Telehealth: Payer: Self-pay

## 2019-07-12 DIAGNOSIS — C8590 Non-Hodgkin lymphoma, unspecified, unspecified site: Secondary | ICD-10-CM | POA: Insufficient documentation

## 2019-07-12 NOTE — Telephone Encounter (Signed)
Called GI cocktail (5-1ml  Q4-6 hrs. prn for pain) total 433ml. [place]. Called patient and gave instructions for taking and let her know they would call her when it is ready.

## 2019-07-12 NOTE — Telephone Encounter (Signed)
Can give her GI cocktail 5-10 ml q4-6 hours as needed for pain. Enough for 10 days. Thanks-JLL

## 2019-07-12 NOTE — Telephone Encounter (Signed)
Pt returned your call.  

## 2019-07-12 NOTE — Telephone Encounter (Signed)
Patient called because she couldn't  remember which pharmacy I had sent her order for a GI cocktail. Let her know it was [facility] and gave her their phone#

## 2019-07-12 NOTE — Telephone Encounter (Signed)
Left message for patient to please call back. 

## 2019-07-13 ENCOUNTER — Other Ambulatory Visit: Payer: Self-pay | Admitting: Internal Medicine

## 2019-07-13 ENCOUNTER — Ambulatory Visit (INDEPENDENT_AMBULATORY_CARE_PROVIDER_SITE_OTHER): Payer: Medicaid Other

## 2019-07-13 DIAGNOSIS — Z1159 Encounter for screening for other viral diseases: Secondary | ICD-10-CM

## 2019-07-13 LAB — SARS CORONAVIRUS 2 (TAT 6-24 HRS): SARS Coronavirus 2: NEGATIVE

## 2019-07-14 ENCOUNTER — Other Ambulatory Visit: Payer: Self-pay

## 2019-07-14 ENCOUNTER — Ambulatory Visit (AMBULATORY_SURGERY_CENTER): Payer: Self-pay | Admitting: *Deleted

## 2019-07-14 VITALS — Temp 97.1°F | Ht 66.0 in | Wt 216.0 lb

## 2019-07-14 DIAGNOSIS — R1013 Epigastric pain: Secondary | ICD-10-CM

## 2019-07-14 NOTE — Progress Notes (Signed)
Mother in Pv today- temp 97.3  No egg or soy allergy known to patient  No issues with past sedation with any surgeries  or procedures, no intubation problems  No diet pills per patient No home 02 use per patient  No blood thinners per patient  Pt states  issues with constipation 3-5 days between BM's No A fib or A flutter  EMMI video sent to pt's e mail   Due to the COVID-19 pandemic we are asking patients to follow these guidelines. Please only bring one care partner. Please be aware that your care partner may wait in the car in the parking lot or if they feel like they will be too hot to wait in the car, they may wait in the lobby on the 4th floor. All care partners are required to wear a mask the entire time (we do not have any that we can provide them), they need to practice social distancing, and we will do a Covid check for all patient's and care partners when you arrive. Also we will check their temperature and your temperature. If the care partner waits in their car they need to stay in the parking lot the entire time and we will call them on their cell phone when the patient is ready for discharge so they can bring the car to the front of the building. Also all patient's will need to wear a mask into building.

## 2019-07-17 ENCOUNTER — Encounter: Payer: Self-pay | Admitting: Internal Medicine

## 2019-07-17 ENCOUNTER — Ambulatory Visit (AMBULATORY_SURGERY_CENTER): Payer: Medicaid Other | Admitting: Internal Medicine

## 2019-07-17 ENCOUNTER — Other Ambulatory Visit: Payer: Self-pay

## 2019-07-17 VITALS — BP 130/78 | HR 67 | Temp 97.9°F | Resp 22 | Ht 66.0 in | Wt 216.0 lb

## 2019-07-17 DIAGNOSIS — K2950 Unspecified chronic gastritis without bleeding: Secondary | ICD-10-CM

## 2019-07-17 DIAGNOSIS — R1013 Epigastric pain: Secondary | ICD-10-CM

## 2019-07-17 MED ORDER — SODIUM CHLORIDE 0.9 % IV SOLN: 500.0000 mL | Freq: Once | INTRAVENOUS | Status: DC

## 2019-07-17 NOTE — Op Note (Signed)
[facility] Patient Name: Christina Gilbert Procedure Date: 07/17/2019 4:07 PM MRN: [id number] Endoscopist: Jerene Bears , MD Age: 23 Referring MD:  Date of Birth: 1995/12/17 Gender: Female Account #: [id number] Procedure:                Upper GI endoscopy Indications:              Epigastric abdominal pain despite PPI Medicines:                Monitored Anesthesia Care Procedure:                Pre-Anesthesia Assessment:                           - Prior to the procedure, a History and Physical                            was performed, and patient medications and                            allergies were reviewed. The patient's tolerance of                            previous anesthesia was also reviewed. The risks                            and benefits of the procedure and the sedation                            options and risks were discussed with the patient.                            All questions were answered, and informed consent                            was obtained. Prior Anticoagulants: The patient has                            taken no previous anticoagulant or antiplatelet                            agents. ASA Grade Assessment: II - A patient with                            mild systemic disease. After reviewing the risks                            and benefits, the patient was deemed in                            satisfactory condition to undergo the procedure.                           After obtaining informed consent, the endoscope was  passed under direct vision. Throughout the                            procedure, the patient's blood pressure, pulse, and                            oxygen saturations were monitored continuously. The                            Endoscope was introduced through the mouth, and                            advanced to the second part of duodenum. The upper                            GI endoscopy was  accomplished without difficulty.                            The patient tolerated the procedure well. Scope In: Scope Out: Findings:                 The examined esophagus was normal.                           Moderate inflammation characterized by adherent                            blood, erosions and erythema was found in the                            gastric body. Biopsies were taken with a cold                            forceps for histology and Helicobacter pylori                            testing.                           The examined duodenum was normal. Complications:            No immediate complications. Estimated Blood Loss:     Estimated blood loss was minimal. Impression:               - Normal esophagus.                           - Gastritis. Biopsied.                           - Normal examined duodenum. Recommendation:           - Patient has a contact number available for                            emergencies. The signs and symptoms of potential  delayed complications were discussed with the                            patient. Return to normal activities tomorrow.                            Written discharge instructions were provided to the                            patient.                           - Resume previous diet.                           - Continue present medications.                           - Await pathology results.                           - If pathology results unrevealing (no H. Pylori                            infection) then abdominal US is recommended to                            evaluate gallbladder (gallstones seen on CT from 3                            years ago) as symptomatic gallstone may explain                            abdominal symptoms. Jerene Bears, MD 07/17/2019 4:41:02 PM This report has been signed electronically.

## 2019-07-17 NOTE — Progress Notes (Signed)
Report to PACU, RN, vss, BBS= Clear.  

## 2019-07-17 NOTE — Progress Notes (Signed)
Temp check by:LC Vital check by:KA  The patient states no changes in medical or surgical history since pre-visit screening on 07/14/2019.

## 2019-07-17 NOTE — Patient Instructions (Signed)
Please read handouts provided. Continue present medications. Await pathology results.      YOU HAD AN ENDOSCOPIC PROCEDURE TODAY AT THE [facility]:   Refer to the procedure report that was given to you for any specific questions about what was found during the examination.  If the procedure report does not answer your questions, please call your gastroenterologist to clarify.  If you requested that your care partner not be given the details of your procedure findings, then the procedure report has been included in a sealed envelope for you to review at your convenience later.  YOU SHOULD EXPECT: Some feelings of bloating in the abdomen. Passage of more gas than usual.  Walking can help get rid of the air that was put into your GI tract during the procedure and reduce the bloating. If you had a lower endoscopy (such as a colonoscopy or flexible sigmoidoscopy) you may notice spotting of blood in your stool or on the toilet paper. If you underwent a bowel prep for your procedure, you may not have a normal bowel movement for a few days.  Please Note:  You might notice some irritation and congestion in your nose or some drainage.  This is from the oxygen used during your procedure.  There is no need for concern and it should clear up in a day or so.  SYMPTOMS TO REPORT IMMEDIATELY:     Following upper endoscopy (EGD)  Vomiting of blood or coffee ground material  New chest pain or pain under the shoulder blades  Painful or persistently difficult swallowing  New shortness of breath  Fever of 100F or higher  Black, tarry-looking stools  For urgent or emergent issues, a gastroenterologist can be reached at any hour by calling [phone].   DIET:  We do recommend a small meal at first, but then you may proceed to your regular diet.  Drink plenty of fluids but you should avoid alcoholic beverages for 24 hours.  ACTIVITY:  You should plan to take it easy for the rest of today  and you should NOT DRIVE or use heavy machinery until tomorrow (because of the sedation medicines used during the test).    FOLLOW UP: Our staff will call the number listed on your records 48-72 hours following your procedure to check on you and address any questions or concerns that you may have regarding the information given to you following your procedure. If we do not reach you, we will leave a message.  We will attempt to reach you two times.  During this call, we will ask if you have developed any symptoms of COVID 19. If you develop any symptoms (ie: fever, flu-like symptoms, shortness of breath, cough etc.) before then, please call [phone].  If you test positive for Covid 19 in the 2 weeks post procedure, please call and report this information to us.    If any biopsies were taken you will be contacted by phone or by letter within the next 1-3 weeks.  Please call us at [phone] if you have not heard about the biopsies in 3 weeks.    SIGNATURES/CONFIDENTIALITY: You and/or your care partner have signed paperwork which will be entered into your electronic medical record.  These signatures attest to the fact that that the information above on your After Visit Summary has been reviewed and is understood.  Full responsibility of the confidentiality of this discharge information lies with you and/or your care-partner. 

## 2019-07-17 NOTE — Progress Notes (Signed)
Called to room to assist during endoscopic procedure.  Patient ID and intended procedure confirmed with present staff. Received instructions for my participation in the procedure from the performing physician.  

## 2019-07-18 ENCOUNTER — Telehealth: Payer: Self-pay | Admitting: Internal Medicine

## 2019-07-18 ENCOUNTER — Telehealth: Payer: Self-pay | Admitting: *Deleted

## 2019-07-18 NOTE — Telephone Encounter (Signed)
Attempted to return patient's call.  Reached voicemail.  Advised patient if she is having pain due to her procedure we can speak to her further, but emphasized that we do not order pain medication.  Encouraged her to call back if there is an issue from procedure, otherwise to follow up with a primary care physician.

## 2019-07-18 NOTE — Telephone Encounter (Signed)
I was able to reach the patient by phone. I did not see her personally after her upper endoscopy yesterday and so I wanted to reach out to her to let her know the results.  We discussed the gastritis found in her stomach and the pending biopsies to investigate for H. Pylori.  We discussed gastritis, H. pylori and also that I would recommend gallbladder imaging if pathology is negative for H. pylori.  She will continue daily PPI for now.  She thanked me for the call.  She was tearful because she is dealing with ongoing epigastric pain and hopes soon for an answer.  I will notify her once I see pathology results

## 2019-07-18 NOTE — Telephone Encounter (Signed)
Patient is furious that you did not come out and speak to her and that she wasn't allowed to have a care partner present in recovery.   Received several F-bombs from her in a phone call this morning... so good luck!

## 2019-07-19 ENCOUNTER — Telehealth: Payer: Self-pay | Admitting: *Deleted

## 2019-07-19 ENCOUNTER — Telehealth: Payer: Self-pay

## 2019-07-19 NOTE — Telephone Encounter (Signed)
  Follow up Call-  Call back number 07/17/2019  Post procedure Call Back phone  # [phone]  Permission to leave phone message Yes  Some recent data might be hidden     Left message

## 2019-07-19 NOTE — Telephone Encounter (Signed)
  Follow up Call-  Call back number 07/17/2019  Post procedure Call Back phone  # [phone]  Permission to leave phone message Yes  Some recent data might be hidden     Patient questions:  Do you have a fever, pain , or abdominal swelling? Yes.   Pain Score  7 *  Have you tolerated food without any problems? Yes.    Have you been able to return to your normal activities? Yes.    Do you have any questions about your discharge instructions: Diet   No. Medications  No. Follow up visit  No.  Do you have questions or concerns about your Care? No.  Actions: * If pain score is 4 or above: 1. Physician/ provider Notified : Have you developed a fever since your procedure? no  2.   Have you had an respiratory symptoms (SOB or cough) since your procedure? no  3.   Have you tested positive for COVID 19 since your procedure no  4.   Have you had any family members/close contacts diagnosed with the COVID 19 since your procedure?  no   If yes to any of these questions please route to Joylene John, RN and Alphonsa Gin, Therapist, sports. Marland Kitchen

## 2019-08-02 ENCOUNTER — Telehealth: Payer: Self-pay | Admitting: Internal Medicine

## 2019-08-02 ENCOUNTER — Other Ambulatory Visit: Payer: Self-pay

## 2019-08-02 DIAGNOSIS — R1013 Epigastric pain: Secondary | ICD-10-CM

## 2019-08-02 NOTE — Telephone Encounter (Signed)
See result note.  

## 2019-08-08 ENCOUNTER — Ambulatory Visit (HOSPITAL_COMMUNITY)
Admission: RE | Admit: 2019-08-08 | Discharge: 2019-08-08 | Disposition: A | Discharge status: Home / Self Care | Payer: Medicaid Other | Source: Ambulatory Visit | Attending: Internal Medicine | Admitting: Internal Medicine

## 2019-08-08 ENCOUNTER — Telehealth: Payer: Self-pay

## 2019-08-08 ENCOUNTER — Other Ambulatory Visit: Payer: Self-pay

## 2019-08-08 DIAGNOSIS — R1013 Epigastric pain: Secondary | ICD-10-CM | POA: Insufficient documentation

## 2019-08-08 NOTE — Telephone Encounter (Signed)
Pt aware. Referral faxed to [facility].

## 2019-08-08 NOTE — Telephone Encounter (Signed)
Would refer her to [facility] for cholecystectomy

## 2019-08-08 NOTE — Telephone Encounter (Signed)
Received call report on [place] for this pt from Mona, see below.  IMPRESSION: 1. Gallstones noted throughout the gallbladder. Questionable small calculus adherent in the neck of the gallbladder. This circumstance may warrant correlation with nuclear medicine hepatobiliary imaging study to assess for cystic duct patency. Note that the gallbladder wall does not appear appreciably thickened, and no pericholecystic fluid is evident on this study.  2.  Study otherwise unremarkable.

## 2019-08-14 ENCOUNTER — Ambulatory Visit: Payer: Self-pay | Admitting: Surgery

## 2019-08-14 DIAGNOSIS — K2900 Acute gastritis without bleeding: Secondary | ICD-10-CM

## 2019-08-14 DIAGNOSIS — K2970 Gastritis, unspecified, without bleeding: Secondary | ICD-10-CM | POA: Insufficient documentation

## 2019-08-14 DIAGNOSIS — K5909 Other constipation: Secondary | ICD-10-CM

## 2019-08-14 DIAGNOSIS — K8010 Calculus of gallbladder with chronic cholecystitis without obstruction: Secondary | ICD-10-CM | POA: Insufficient documentation

## 2019-08-14 NOTE — H&P (Signed)
Christina Gilbert Documented: 08/14/2019 11:00 AM Location: [facility] Patient #: [id number] DOB: [birth year] Single / Language: Christina Gilbert / Race: Black or African American Female  History of Present Illness Christina Hector MD; 08/14/2019 11:40 AM) The patient is a 24 year old female who presents for evaluation of gall stones. Note for "Gall stones": ` ` ` Patient sent for surgical consultation at the request of Dr [name], [facility]  Chief Complaint: Epigastric pain. Possible symptomatically gallstones ` ` The patient is a woman that is struggle with intermittent crampy abdominal pain. Sounds like she's had constipation for [name] of her life. Moves her bowels once a week. Has been on MiraLAX the past. Patient notes for the past 6 months she's had upper abdominal pain. Seems to be mainly epigastric. Denies many triggers to it although spicy food sensitivity irritated. She had an episode in September where she had good emergency room with nausea and vomiting. A burping or belching. She saw gastroenterology. No improvement on Bentyl antispasmodics. Underwent upper endoscopy. Gastritis noted. Not Helicobacter pylori positive. She's been on proton pump inhibitors for the past month with increased dose without release of symptoms. He has a history of non-Hodgkin's lymphoma treated in the distance past at age 54. No evidence of any recurrence or lymphadenopathy on a CAT scan 3 years ago. Gallstones noted then. Ultrasound notes gallstones again. One possibly impacted at the neck but no cholecystitis. Surgical consultation offered.  No personal nor family history of GI/colon cancer, inflammatory bowel disease, allergy such as Celiac Sprue, dietary/dairy problems, colitis, ulcers. No recent sick contacts/gastroenteritis. No travel outside the country. No changes in diet. No dysphagia to solids or liquids. Denies any early satiety or bloating. Not on chronic pain  medications. No hematochezia, hematemesis, coffee ground emesis. No evidence of prior gastric/peptic ulceration. She works as a Psychologist, counselling.   (Review of systems as stated in this history (HPI) or in the review of systems. Otherwise all other 12 point ROS are negative) ` ` `   Past Surgical History Illene Regulus, CMA; 08/14/2019 11:01 AM) Tonsillectomy  Diagnostic Studies History [name], CMA; 08/14/2019 11:01 AM) Mammogram never  Allergies [name], CMA; 08/14/2019 11:02 AM) No Known Drug Allergies [08/14/2019]:  Medication History ([name], CMA; 08/14/2019 11:02 AM) No Current Medications Medications Reconciled  Social History Illene Regulus, CMA; 08/14/2019 11:01 AM) Alcohol use Recently quit alcohol use. No caffeine use No drug use Tobacco use Never smoker.  Pregnancy / Birth History Illene Regulus, CMA; 08/14/2019 11:01 AM) Age at menarche 84 years. Age of menopause <44 Gravida 3 Length (months) of breastfeeding 7-12 Maternal age 48-20 Para 3 Regular periods     Review of Systems [name] CMA; 08/14/2019 11:01 AM) General Not Present- Appetite Loss, Chills, Fatigue, Fever, Night Sweats, Weight Gain and Weight Loss. Skin Not Present- Change in Wart/Mole, Dryness, Hives, Jaundice, New Lesions, Non-Healing Wounds, Rash and Ulcer. HEENT Not Present- Earache, Hearing Loss, Hoarseness, Nose Bleed, Oral Ulcers, Ringing in the Ears, Seasonal Allergies, Sinus Pain, Sore Throat, Visual Disturbances, Wears glasses/contact lenses and Yellow Eyes. Respiratory Not Present- Bloody sputum, Chronic Cough, Difficulty Breathing, Snoring and Wheezing. Breast Not Present- Breast Mass, Breast Pain, Nipple Discharge and Skin Changes. Cardiovascular Not Present- Chest Pain, Difficulty Breathing Lying Down, Leg Cramps, Palpitations, Rapid Heart Rate, Shortness of Breath and Swelling of Extremities. Gastrointestinal Present-  Abdominal Pain. Not Present- Bloating, Bloody Stool, Change in Bowel Habits, Chronic diarrhea, Constipation, Difficulty Swallowing, Excessive gas, Gets full quickly at  meals, Hemorrhoids, Indigestion, Nausea, Rectal Pain and Vomiting. Female Genitourinary Not Present- Frequency, Nocturia, Painful Urination, Pelvic Pain and Urgency. Musculoskeletal Not Present- Back Pain, Joint Pain, Joint Stiffness, Muscle Pain, Muscle Weakness and Swelling of Extremities. Neurological Not Present- Decreased Memory, Fainting, Headaches, Numbness, Seizures, Tingling, Tremor, Trouble walking and Weakness. Psychiatric Not Present- Anxiety, Bipolar, Change in Sleep Pattern, Depression, Fearful and Frequent crying. Endocrine Not Present- Cold Intolerance, Excessive Hunger, Hair Changes, Heat Intolerance, Hot flashes and New Diabetes. Hematology Not Present- Blood Thinners, Easy Bruising, Excessive bleeding, Gland problems, HIV and Persistent Infections.  Vitals ([name] CMA; 08/14/2019 11:02 AM) 08/14/2019 11:01 AM Weight: 219 lb Height: 66in Body Surface Area: 2.08 m Body Mass Index: 35.35 kg/m  Temp.: 98.97F(Oral)  Pulse: 125 (Regular)  BP: 128/82 (Sitting, Left Arm, Standard)        Physical Exam Christina Hector MD; 08/14/2019 11:32 AM)  General Mental Status-Alert. General Appearance-Not in acute distress, Not Sickly. Orientation-Oriented X3. Hydration-Well hydrated. Voice-Normal.  Integumentary Global Assessment Upon inspection and palpation of skin surfaces of the - Axillae: non-tender, no inflammation or ulceration, no drainage. and Distribution of scalp and body hair is normal. General Characteristics Temperature - normal warmth is noted.  Head and Neck Head-normocephalic, atraumatic with no lesions or palpable masses. Face Global Assessment - atraumatic, no absence of expression. Neck Global Assessment - no abnormal movements, no bruit auscultated on the  right, no bruit auscultated on the left, no decreased range of motion, non-tender. Trachea-midline. Thyroid Gland Characteristics - non-tender.  Eye Eyeball - Left-Extraocular movements intact, No Nystagmus - Left. Eyeball - Right-Extraocular movements intact, No Nystagmus - Right. Cornea - Left-No Hazy - Left. Cornea - Right-No Hazy - Right. Sclera/Conjunctiva - Left-No scleral icterus, No Discharge - Left. Sclera/Conjunctiva - Right-No scleral icterus, No Discharge - Right. Pupil - Left-Direct reaction to light normal. Pupil - Right-Direct reaction to light normal.  ENMT Ears Pinna - Left - no drainage observed, no generalized tenderness observed. Pinna - Right - no drainage observed, no generalized tenderness observed. Nose and Sinuses External Inspection of the Nose - no destructive lesion observed. Inspection of the nares - Left - quiet respiration. Inspection of the nares - Right - quiet respiration. Mouth and Throat Lips - Upper Lip - no fissures observed, no pallor noted. Lower Lip - no fissures observed, no pallor noted. Nasopharynx - no discharge present. Oral Cavity/Oropharynx - Tongue - no dryness observed. Oral Mucosa - no cyanosis observed. Hypopharynx - no evidence of airway distress observed.  Chest and Lung Exam Inspection Movements - Normal and Symmetrical. Accessory muscles - No use of accessory muscles in breathing. Palpation Palpation of the chest reveals - Non-tender. Auscultation Breath sounds - Normal and Clear.  Cardiovascular Auscultation Rhythm - Regular. Murmurs & Other Heart Sounds - Auscultation of the heart reveals - No Murmurs and No Systolic Clicks.  Abdomen Inspection Inspection of the abdomen reveals - No Visible peristalsis and No Abnormal pulsations. Umbilicus - No Bleeding, No Urine drainage. Palpation/Percussion Palpation and Percussion of the abdomen reveal - Soft, Non Tender, No Rebound tenderness, No Rigidity  (guarding) and No Cutaneous hyperesthesia. Note: Abdomen obese but soft. Some epigastric and bilateral upper quadrant discomfort. Not classic Murphy sign. No umbilical hernia. Infraumbilical short incision without hernia. Not severely distended. No diastasis recti. No umbilical or other anterior abdominal wall hernias  Female Genitourinary Sexual Maturity Tanner 5 - Adult hair pattern. Note: No vaginal bleeding nor discharge  Peripheral Vascular Upper Extremity Inspection - Left -  No Cyanotic nailbeds - Left, Not Ischemic. Inspection - Right - No Cyanotic nailbeds - Right, Not Ischemic.  Neurologic Neurologic evaluation reveals -normal attention span and ability to concentrate, able to name objects and repeat phrases. Appropriate fund of knowledge , normal sensation and normal coordination. Mental Status Affect - not angry, not paranoid. Cranial Nerves-Normal Bilaterally. Gait-Normal.  Neuropsychiatric Mental status exam performed with findings of-able to articulate well with normal speech/language, rate, volume and coherence, thought content normal with ability to perform basic computations and apply abstract reasoning and no evidence of hallucinations, delusions, obsessions or homicidal/suicidal ideation.  Musculoskeletal Global Assessment Spine, Ribs and Pelvis - no instability, subluxation or laxity. Right Upper Extremity - no instability, subluxation or laxity.  Lymphatic Head & Neck  General Head & Neck Lymphatics: Bilateral - Description - No Localized lymphadenopathy. Axillary  General Axillary Region: Bilateral - Description - No Localized lymphadenopathy. Femoral & Inguinal  Generalized Femoral & Inguinal Lymphatics: Left - Description - No Localized lymphadenopathy. Right - Description - No Localized lymphadenopathy.    Assessment & Plan Christina Hector MD; 08/14/2019 11:40 AM)  CHRONIC CHOLECYSTITIS WITH CALCULUS (K80.10) Impression: Epigastric pain  with intermittent episodes of nausea and vomiting. Seen to be triggered more by spicy foods then fatty foods. Lack of improvement with aggressive acid blockade. Only mild gastritis noted on upper endoscopy.  I suspect that she has symptomatic gallstones. The rest of her differential diagnosis is otherwise underwhelming. Offered cholecystectomy. The biggest risk would be that this does not solve the problem and there is some etiology for her symptoms. However, she's seen gastroenterology and does not have any cardiopulmonary issues. Patient notes her symptoms are very similar to what her mother had an those resolve cholecystectomy. She wishes to consider cholecystectomy. I think that is reasonable. We will coordinate a convenient time. She's been pretty miserable with this so I don't think she can wait for a long period.  I did note that constipation exacerbates any abdominal pain. I strongly recommended a more aggressive bowel regimen. She'll reconsider going back on MiraLAX  40 minutes spent in chart review, evaluation, examination, counseling, and coordination of care. More than 50% of that time was spent in counseling.  Current Plans You are being scheduled for surgery- Our schedulers will call you.  You should hear from our office's scheduling department within 5 working days about the location, date, and time of surgery. We try to make accommodations for patient's preferences in scheduling surgery, but sometimes the OR schedule or the surgeon's schedule prevents [place] from making those accommodations.  If you have not heard from our office [phone]) in 5 working days, call the office and ask for your surgeon's nurse.  If you have other questions about your diagnosis, plan, or surgery, call the office and ask for your surgeon's nurse.  Written instructions provided Pt Education - Pamphlet Given - Laparoscopic Gallbladder Surgery: discussed with patient and provided information. The  anatomy & physiology of hepatobiliary & pancreatic function was discussed. The pathophysiology of gallbladder dysfunction was discussed. Natural history risks without surgery was discussed. I feel the risks of no intervention will lead to serious problems that outweigh the operative risks; therefore, I recommended cholecystectomy to remove the pathology. I explained laparoscopic techniques with possible need for an open approach. Probable cholangiogram to evaluate the bilary tract was explained as well.  Risks such as bleeding, infection, abscess, leak, injury to other organs, need for further treatment, heart attack, death, and other risks were discussed.  I noted a good likelihood this will help address the problem. Possibility that this will not correct all abdominal symptoms was explained. Goals of post-operative recovery were discussed as well. We will work to minimize complications. An educational handout further explaining the pathology and treatment options was given as well. Questions were answered. The patient expresses understanding & wishes to proceed with surgery.  Pt Education - [facility] Laparosopic Post Op HCI ([place]) Pt Education - Laparoscopic Cholecystectomy: gallbladder  CHRONIC CONSTIPATION (K59.09) Impression: I strongly recommend she do an aggressive fiber bowel regimen to move her bowels at least every other day instead of once a week. Consider restarting MiraLAX.  Current Plans Pt Education - [facility] Constipation ([place]) Pt Education - [facility] Good Bowel Health ([place])  Christina Hector, MD, FACS, MASCRS Gastrointestinal and Minimally Invasive Surgery  [facility] [address], [place], [place] [address] [phone] Main / Paging [phone] Fax

## 2019-08-14 NOTE — H&P (View-Only) (Signed)
Christina Gilbert Documented: 08/14/2019 11:00 AM Location: [facility] Patient #: [id number] DOB: 1995/11/21 Single / Language: Christina Gilbert / Race: Black or African American Female  History of Present Illness Christina Hector MD; 08/14/2019 11:40 AM) The patient is a 24 year old female who presents for evaluation of gall stones. Note for "Gall stones": ` ` ` Patient sent for surgical consultation at the request of Dr [name], [facility]  Chief Complaint: Epigastric pain. Possible symptomatically gallstones ` ` The patient is a woman that is struggle with intermittent crampy abdominal pain. Sounds like she's had constipation for [name] of her life. Moves her bowels once a week. Has been on MiraLAX the past. Patient notes for the past 6 months she's had upper abdominal pain. Seems to be mainly epigastric. Denies many triggers to it although spicy food sensitivity irritated. She had an episode in September where she had good emergency room with nausea and vomiting. A burping or belching. She saw gastroenterology. No improvement on Bentyl antispasmodics. Underwent upper endoscopy. Gastritis noted. Not Helicobacter pylori positive. She's been on proton pump inhibitors for the past month with increased dose without release of symptoms. He has a history of non-Hodgkin's lymphoma treated in the distance past at age 63. No evidence of any recurrence or lymphadenopathy on a CAT scan 3 years ago. Gallstones noted then. Ultrasound notes gallstones again. One possibly impacted at the neck but no cholecystitis. Surgical consultation offered.  No personal nor family history of GI/colon cancer, inflammatory bowel disease, allergy such as Celiac Sprue, dietary/dairy problems, colitis, ulcers. No recent sick contacts/gastroenteritis. No travel outside the country. No changes in diet. No dysphagia to solids or liquids. Denies any early satiety or bloating. Not on chronic pain  medications. No hematochezia, hematemesis, coffee ground emesis. No evidence of prior gastric/peptic ulceration. She works as a Psychologist, counselling.   (Review of systems as stated in this history (HPI) or in the review of systems. Otherwise all other 12 point ROS are negative) ` ` `   Past Surgical History Illene Gilbert, CMA; 08/14/2019 11:01 AM) Tonsillectomy  Diagnostic Studies History Christina Gilbert, CMA; 08/14/2019 11:01 AM) Mammogram never  Allergies Christina Gilbert, CMA; 08/14/2019 11:02 AM) No Known Drug Allergies [08/14/2019]:  Medication History (Christina Gilbert, CMA; 08/14/2019 11:02 AM) No Current Medications Medications Reconciled  Social History Illene Gilbert, CMA; 08/14/2019 11:01 AM) Alcohol use Recently quit alcohol use. No caffeine use No drug use Tobacco use Never smoker.  Pregnancy / Birth History Illene Gilbert, CMA; 08/14/2019 11:01 AM) Age at menarche 67 years. Age of menopause <44 Gravida 3 Length (months) of breastfeeding 7-12 Maternal age 38-20 Para 3 Regular periods     Review of Systems Christina Gilbert CMA; 08/14/2019 11:01 AM) General Not Present- Appetite Loss, Chills, Fatigue, Fever, Night Sweats, Weight Gain and Weight Loss. Skin Not Present- Change in Wart/Mole, Dryness, Hives, Jaundice, New Lesions, Non-Healing Wounds, Rash and Ulcer. HEENT Not Present- Earache, Hearing Loss, Hoarseness, Nose Bleed, Oral Ulcers, Ringing in the Ears, Seasonal Allergies, Sinus Pain, Sore Throat, Visual Disturbances, Wears glasses/contact lenses and Yellow Eyes. Respiratory Not Present- Bloody sputum, Chronic Cough, Difficulty Breathing, Snoring and Wheezing. Breast Not Present- Breast Mass, Breast Pain, Nipple Discharge and Skin Changes. Cardiovascular Not Present- Chest Pain, Difficulty Breathing Lying Down, Leg Cramps, Palpitations, Rapid Heart Rate, Shortness of Breath and Swelling of Extremities. Gastrointestinal Present-  Abdominal Pain. Not Present- Bloating, Bloody Stool, Change in Bowel Habits, Chronic diarrhea, Constipation, Difficulty Swallowing, Excessive gas, Gets full quickly at  meals, Hemorrhoids, Indigestion, Nausea, Rectal Pain and Vomiting. Female Genitourinary Not Present- Frequency, Nocturia, Painful Urination, Pelvic Pain and Urgency. Musculoskeletal Not Present- Back Pain, Joint Pain, Joint Stiffness, Muscle Pain, Muscle Weakness and Swelling of Extremities. Neurological Not Present- Decreased Memory, Fainting, Headaches, Numbness, Seizures, Tingling, Tremor, Trouble walking and Weakness. Psychiatric Not Present- Anxiety, Bipolar, Change in Sleep Pattern, Depression, Fearful and Frequent crying. Endocrine Not Present- Cold Intolerance, Excessive Hunger, Hair Changes, Heat Intolerance, Hot flashes and New Diabetes. Hematology Not Present- Blood Thinners, Easy Bruising, Excessive bleeding, Gland problems, HIV and Persistent Infections.  Vitals (Christina Gilbert CMA; 08/14/2019 11:02 AM) 08/14/2019 11:01 AM Weight: 219 lb Height: 66in Body Surface Area: 2.08 m Body Mass Index: 35.35 kg/m  Temp.: 98.85F(Oral)  Pulse: 125 (Regular)  BP: 128/82 (Sitting, Left Arm, Standard)        Physical Exam Christina Hector MD; 08/14/2019 11:32 AM)  General Mental Status-Alert. General Appearance-Not in acute distress, Not Sickly. Orientation-Oriented X3. Hydration-Well hydrated. Voice-Normal.  Integumentary Global Assessment Upon inspection and palpation of skin surfaces of the - Axillae: non-tender, no inflammation or ulceration, no drainage. and Distribution of scalp and body hair is normal. General Characteristics Temperature - normal warmth is noted.  Head and Neck Head-normocephalic, atraumatic with no lesions or palpable masses. Face Global Assessment - atraumatic, no absence of expression. Neck Global Assessment - no abnormal movements, no bruit auscultated on the  right, no bruit auscultated on the left, no decreased range of motion, non-tender. Trachea-midline. Thyroid Gland Characteristics - non-tender.  Eye Eyeball - Left-Extraocular movements intact, No Nystagmus - Left. Eyeball - Right-Extraocular movements intact, No Nystagmus - Right. Cornea - Left-No Hazy - Left. Cornea - Right-No Hazy - Right. Sclera/Conjunctiva - Left-No scleral icterus, No Discharge - Left. Sclera/Conjunctiva - Right-No scleral icterus, No Discharge - Right. Pupil - Left-Direct reaction to light normal. Pupil - Right-Direct reaction to light normal.  ENMT Ears Pinna - Left - no drainage observed, no generalized tenderness observed. Pinna - Right - no drainage observed, no generalized tenderness observed. Nose and Sinuses External Inspection of the Nose - no destructive lesion observed. Inspection of the nares - Left - quiet respiration. Inspection of the nares - Right - quiet respiration. Mouth and Throat Lips - Upper Lip - no fissures observed, no pallor noted. Lower Lip - no fissures observed, no pallor noted. Nasopharynx - no discharge present. Oral Cavity/Oropharynx - Tongue - no dryness observed. Oral Mucosa - no cyanosis observed. Hypopharynx - no evidence of airway distress observed.  Chest and Lung Exam Inspection Movements - Normal and Symmetrical. Accessory muscles - No use of accessory muscles in breathing. Palpation Palpation of the chest reveals - Non-tender. Auscultation Breath sounds - Normal and Clear.  Cardiovascular Auscultation Rhythm - Regular. Murmurs & Other Heart Sounds - Auscultation of the heart reveals - No Murmurs and No Systolic Clicks.  Abdomen Inspection Inspection of the abdomen reveals - No Visible peristalsis and No Abnormal pulsations. Umbilicus - No Bleeding, No Urine drainage. Palpation/Percussion Palpation and Percussion of the abdomen reveal - Soft, Non Tender, No Rebound tenderness, No Rigidity  (guarding) and No Cutaneous hyperesthesia. Note: Abdomen obese but soft. Some epigastric and bilateral upper quadrant discomfort. Not classic Murphy sign. No umbilical hernia. Infraumbilical short incision without hernia. Not severely distended. No diastasis recti. No umbilical or other anterior abdominal wall hernias  Female Genitourinary Sexual Maturity Tanner 5 - Adult hair pattern. Note: No vaginal bleeding nor discharge  Peripheral Vascular Upper Extremity Inspection - Left -  No Cyanotic nailbeds - Left, Not Ischemic. Inspection - Right - No Cyanotic nailbeds - Right, Not Ischemic.  Neurologic Neurologic evaluation reveals -normal attention span and ability to concentrate, able to name objects and repeat phrases. Appropriate fund of knowledge , normal sensation and normal coordination. Mental Status Affect - not angry, not paranoid. Cranial Nerves-Normal Bilaterally. Gait-Normal.  Neuropsychiatric Mental status exam performed with findings of-able to articulate well with normal speech/language, rate, volume and coherence, thought content normal with ability to perform basic computations and apply abstract reasoning and no evidence of hallucinations, delusions, obsessions or homicidal/suicidal ideation.  Musculoskeletal Global Assessment Spine, Ribs and Pelvis - no instability, subluxation or laxity. Right Upper Extremity - no instability, subluxation or laxity.  Lymphatic Head & Neck  General Head & Neck Lymphatics: Bilateral - Description - No Localized lymphadenopathy. Axillary  General Axillary Region: Bilateral - Description - No Localized lymphadenopathy. Femoral & Inguinal  Generalized Femoral & Inguinal Lymphatics: Left - Description - No Localized lymphadenopathy. Right - Description - No Localized lymphadenopathy.    Assessment & Plan Christina Hector MD; 08/14/2019 11:40 AM)  CHRONIC CHOLECYSTITIS WITH CALCULUS (K80.10) Impression: Epigastric pain  with intermittent episodes of nausea and vomiting. Seen to be triggered more by spicy foods then fatty foods. Lack of improvement with aggressive acid blockade. Only mild gastritis noted on upper endoscopy.  I suspect that she has symptomatic gallstones. The rest of her differential diagnosis is otherwise underwhelming. Offered cholecystectomy. The biggest risk would be that this does not solve the problem and there is some etiology for her symptoms. However, she's seen gastroenterology and does not have any cardiopulmonary issues. Patient notes her symptoms are very similar to what her mother had an those resolve cholecystectomy. She wishes to consider cholecystectomy. I think that is reasonable. We will coordinate a convenient time. She's been pretty miserable with this so I don't think she can wait for a long period.  I did note that constipation exacerbates any abdominal pain. I strongly recommended a more aggressive bowel regimen. She'll reconsider going back on MiraLAX  40 minutes spent in chart review, evaluation, examination, counseling, and coordination of care. More than 50% of that time was spent in counseling.  Current Plans You are being scheduled for surgery- Our schedulers will call you.  You should hear from our office's scheduling department within 5 working days about the location, date, and time of surgery. We try to make accommodations for patient's preferences in scheduling surgery, but sometimes the OR schedule or the surgeon's schedule prevents [place] from making those accommodations.  If you have not heard from our office [phone]) in 5 working days, call the office and ask for your surgeon's nurse.  If you have other questions about your diagnosis, plan, or surgery, call the office and ask for your surgeon's nurse.  Written instructions provided Pt Education - Pamphlet Given - Laparoscopic Gallbladder Surgery: discussed with patient and provided information. The  anatomy & physiology of hepatobiliary & pancreatic function was discussed. The pathophysiology of gallbladder dysfunction was discussed. Natural history risks without surgery was discussed. I feel the risks of no intervention will lead to serious problems that outweigh the operative risks; therefore, I recommended cholecystectomy to remove the pathology. I explained laparoscopic techniques with possible need for an open approach. Probable cholangiogram to evaluate the bilary tract was explained as well.  Risks such as bleeding, infection, abscess, leak, injury to other organs, need for further treatment, heart attack, death, and other risks were discussed.  I noted a good likelihood this will help address the problem. Possibility that this will not correct all abdominal symptoms was explained. Goals of post-operative recovery were discussed as well. We will work to minimize complications. An educational handout further explaining the pathology and treatment options was given as well. Questions were answered. The patient expresses understanding & wishes to proceed with surgery.  Pt Education - CCS Laparosopic Post Op HCI (Laurianne Floresca) Pt Education - Laparoscopic Cholecystectomy: gallbladder  CHRONIC CONSTIPATION (K59.09) Impression: I strongly recommend she do an aggressive fiber bowel regimen to move her bowels at least every other day instead of once a week. Consider restarting MiraLAX.  Current Plans Pt Education - CCS Constipation (AT) Pt Education - CCS Good Bowel Health (Yvonnie Schinke)  Christina Hector, MD, FACS, MASCRS Gastrointestinal and Minimally Invasive Surgery  [facility] [address], [place], [place] [address] [phone] Main / Paging [phone] Fax

## 2019-08-18 ENCOUNTER — Encounter (HOSPITAL_BASED_OUTPATIENT_CLINIC_OR_DEPARTMENT_OTHER): Payer: Self-pay | Admitting: Surgery

## 2019-08-18 ENCOUNTER — Other Ambulatory Visit: Payer: Self-pay

## 2019-08-18 NOTE — Progress Notes (Signed)
Spoke with: Tnya NPO:  After Midnight, no gum, candy, or mints  Arrival time: 0800 AM Labs: N/A AM medications: None Pre op orders: Yes Ride home: Joshereece (mom) [phone]

## 2019-08-19 ENCOUNTER — Other Ambulatory Visit (HOSPITAL_COMMUNITY)
Admission: RE | Admit: 2019-08-19 | Discharge: 2019-08-19 | Disposition: A | Discharge status: Home / Self Care | Payer: Medicaid Other | Source: Ambulatory Visit | Attending: Surgery | Admitting: Surgery

## 2019-08-19 DIAGNOSIS — Z20822 Contact with and (suspected) exposure to covid-19: Secondary | ICD-10-CM | POA: Diagnosis not present

## 2019-08-19 DIAGNOSIS — Z01812 Encounter for preprocedural laboratory examination: Secondary | ICD-10-CM | POA: Diagnosis not present

## 2019-08-20 LAB — NOVEL CORONAVIRUS, NAA (HOSP ORDER, SEND-OUT TO REF LAB; TAT 18-24 HRS): SARS-CoV-2, NAA: NOT DETECTED

## 2019-08-23 ENCOUNTER — Ambulatory Visit (HOSPITAL_BASED_OUTPATIENT_CLINIC_OR_DEPARTMENT_OTHER): Payer: Medicaid Other | Admitting: Anesthesiology

## 2019-08-23 ENCOUNTER — Ambulatory Visit (HOSPITAL_BASED_OUTPATIENT_CLINIC_OR_DEPARTMENT_OTHER)
Admission: RE | Admit: 2019-08-23 | Discharge: 2019-08-23 | Disposition: A | Discharge status: Home / Self Care | Payer: Medicaid Other | Attending: Surgery | Admitting: Surgery

## 2019-08-23 ENCOUNTER — Encounter (HOSPITAL_BASED_OUTPATIENT_CLINIC_OR_DEPARTMENT_OTHER): Payer: Self-pay | Admitting: Surgery

## 2019-08-23 ENCOUNTER — Encounter (HOSPITAL_BASED_OUTPATIENT_CLINIC_OR_DEPARTMENT_OTHER)
Admission: RE | Disposition: A | Discharge status: Home / Self Care | Payer: Self-pay | Source: Home / Self Care | Attending: Surgery

## 2019-08-23 DIAGNOSIS — K658 Other peritonitis: Secondary | ICD-10-CM | POA: Diagnosis not present

## 2019-08-23 DIAGNOSIS — Z8572 Personal history of non-Hodgkin lymphomas: Secondary | ICD-10-CM | POA: Diagnosis not present

## 2019-08-23 DIAGNOSIS — K8010 Calculus of gallbladder with chronic cholecystitis without obstruction: Secondary | ICD-10-CM | POA: Diagnosis not present

## 2019-08-23 DIAGNOSIS — K5909 Other constipation: Secondary | ICD-10-CM | POA: Insufficient documentation

## 2019-08-23 DIAGNOSIS — K660 Peritoneal adhesions (postprocedural) (postinfection): Secondary | ICD-10-CM | POA: Diagnosis not present

## 2019-08-23 HISTORY — DX: Herpesviral infection, unspecified: B00.9

## 2019-08-23 HISTORY — PX: LAPAROSCOPIC CHOLECYSTECTOMY SINGLE SITE WITH INTRAOPERATIVE CHOLANGIOGRAM: SHX6538

## 2019-08-23 LAB — POCT PREGNANCY, URINE: Preg Test, Ur: NEGATIVE

## 2019-08-23 SURGERY — LAPAROSCOPIC CHOLECYSTECTOMY SINGLE SITE WITH INTRAOPERATIVE CHOLANGIOGRAM
Anesthesia: General | Site: Abdomen

## 2019-08-23 MED ORDER — ACETAMINOPHEN 500 MG PO TABS
ORAL_TABLET | ORAL | Status: AC
  Filled 2019-08-23: qty 2

## 2019-08-23 MED ORDER — PROPOFOL 10 MG/ML IV BOLUS
INTRAVENOUS | Status: AC
  Filled 2019-08-23: qty 20

## 2019-08-23 MED ORDER — GABAPENTIN 300 MG PO CAPS
300.0000 mg | ORAL_CAPSULE | ORAL | Status: AC
  Administered 2019-08-23: 300 mg via ORAL
  Filled 2019-08-23: qty 1

## 2019-08-23 MED ORDER — ONDANSETRON HCL 4 MG/2ML IJ SOLN
INTRAMUSCULAR | Status: AC
  Filled 2019-08-23: qty 2

## 2019-08-23 MED ORDER — TRAMADOL HCL 50 MG PO TABS: 50.0000 mg | ORAL_TABLET | Freq: Four times a day (QID) | ORAL | 0 refills | Status: DC | PRN

## 2019-08-23 MED ORDER — DEXAMETHASONE SODIUM PHOSPHATE 10 MG/ML IJ SOLN
INTRAMUSCULAR | Status: DC | PRN
  Administered 2019-08-23: 8 mg via INTRAVENOUS

## 2019-08-23 MED ORDER — ONDANSETRON HCL 4 MG/2ML IJ SOLN
4.0000 mg | Freq: Once | INTRAMUSCULAR | Status: DC | PRN
  Filled 2019-08-23: qty 2

## 2019-08-23 MED ORDER — FENTANYL CITRATE (PF) 100 MCG/2ML IJ SOLN
25.0000 ug | INTRAMUSCULAR | Status: DC | PRN
  Administered 2019-08-23 (×2): 50 ug via INTRAVENOUS
  Filled 2019-08-23: qty 1

## 2019-08-23 MED ORDER — BUPIVACAINE HCL (PF) 0.25 % IJ SOLN
INTRAMUSCULAR | Status: AC
  Filled 2019-08-23: qty 30

## 2019-08-23 MED ORDER — KETAMINE HCL 10 MG/ML IJ SOLN
INTRAMUSCULAR | Status: DC | PRN
  Administered 2019-08-23: 30 mg via INTRAVENOUS

## 2019-08-23 MED ORDER — ONDANSETRON HCL 4 MG/2ML IJ SOLN
INTRAMUSCULAR | Status: DC | PRN
  Administered 2019-08-23: 4 mg via INTRAVENOUS

## 2019-08-23 MED ORDER — ROCURONIUM BROMIDE 10 MG/ML (PF) SYRINGE
PREFILLED_SYRINGE | INTRAVENOUS | Status: DC | PRN
  Administered 2019-08-23: 10 mg via INTRAVENOUS
  Administered 2019-08-23: 60 mg via INTRAVENOUS
  Administered 2019-08-23: 10 mg via INTRAVENOUS

## 2019-08-23 MED ORDER — LIDOCAINE 2% (20 MG/ML) 5 ML SYRINGE
INTRAMUSCULAR | Status: DC | PRN
  Administered 2019-08-23: 1.5 mg/kg/h via INTRAVENOUS

## 2019-08-23 MED ORDER — BUPIVACAINE LIPOSOME 1.3 % IJ SUSP
INTRAMUSCULAR | Status: AC
  Filled 2019-08-23: qty 20

## 2019-08-23 MED ORDER — MIDAZOLAM HCL 2 MG/2ML IJ SOLN
1.0000 mg | Freq: Once | INTRAMUSCULAR | Status: AC
  Administered 2019-08-23: 1 mg via INTRAVENOUS
  Filled 2019-08-23: qty 1

## 2019-08-23 MED ORDER — SODIUM CHLORIDE 0.9 % IR SOLN
Status: DC | PRN
  Administered 2019-08-23 (×2): 1000 mL

## 2019-08-23 MED ORDER — FENTANYL CITRATE (PF) 100 MCG/2ML IJ SOLN
INTRAMUSCULAR | Status: AC
  Filled 2019-08-23: qty 2

## 2019-08-23 MED ORDER — FENTANYL CITRATE (PF) 250 MCG/5ML IJ SOLN
INTRAMUSCULAR | Status: AC
  Filled 2019-08-23: qty 5

## 2019-08-23 MED ORDER — CHLORHEXIDINE GLUCONATE CLOTH 2 % EX PADS
6.0000 | MEDICATED_PAD | Freq: Once | CUTANEOUS | Status: DC
  Filled 2019-08-23: qty 6

## 2019-08-23 MED ORDER — MIDAZOLAM HCL 2 MG/2ML IJ SOLN
INTRAMUSCULAR | Status: DC | PRN
  Administered 2019-08-23: 2 mg via INTRAVENOUS

## 2019-08-23 MED ORDER — FENTANYL CITRATE (PF) 100 MCG/2ML IJ SOLN
INTRAMUSCULAR | Status: DC | PRN
  Administered 2019-08-23: 50 ug via INTRAVENOUS
  Administered 2019-08-23: 100 ug via INTRAVENOUS
  Administered 2019-08-23 (×2): 50 ug via INTRAVENOUS
  Administered 2019-08-23: 100 ug via INTRAVENOUS

## 2019-08-23 MED ORDER — MIDAZOLAM HCL 2 MG/2ML IJ SOLN
INTRAMUSCULAR | Status: AC
  Filled 2019-08-23: qty 2

## 2019-08-23 MED ORDER — ROCURONIUM BROMIDE 10 MG/ML (PF) SYRINGE
PREFILLED_SYRINGE | INTRAVENOUS | Status: AC
  Filled 2019-08-23: qty 10

## 2019-08-23 MED ORDER — BUPIVACAINE-EPINEPHRINE 0.25% -1:200000 IJ SOLN
INTRAMUSCULAR | Status: DC | PRN
  Administered 2019-08-23: 30 mL

## 2019-08-23 MED ORDER — ACETAMINOPHEN 500 MG PO TABS
1000.0000 mg | ORAL_TABLET | ORAL | Status: AC
  Administered 2019-08-23: 1000 mg via ORAL
  Filled 2019-08-23: qty 2

## 2019-08-23 MED ORDER — ARTIFICIAL TEARS OPHTHALMIC OINT
TOPICAL_OINTMENT | OPHTHALMIC | Status: AC
  Filled 2019-08-23: qty 3.5

## 2019-08-23 MED ORDER — PROPOFOL 10 MG/ML IV BOLUS
INTRAVENOUS | Status: DC | PRN
  Administered 2019-08-23: 20 mg via INTRAVENOUS
  Administered 2019-08-23: 200 mg via INTRAVENOUS
  Administered 2019-08-23: 20 mg via INTRAVENOUS

## 2019-08-23 MED ORDER — KETOROLAC TROMETHAMINE 30 MG/ML IJ SOLN
INTRAMUSCULAR | Status: DC | PRN
  Administered 2019-08-23: 30 mg via INTRAVENOUS

## 2019-08-23 MED ORDER — LIDOCAINE 2% (20 MG/ML) 5 ML SYRINGE
INTRAMUSCULAR | Status: AC
  Filled 2019-08-23: qty 5

## 2019-08-23 MED ORDER — LIDOCAINE HCL 2 % IJ SOLN
INTRAMUSCULAR | Status: AC
  Filled 2019-08-23: qty 20

## 2019-08-23 MED ORDER — GABAPENTIN 300 MG PO CAPS
ORAL_CAPSULE | ORAL | Status: AC
  Filled 2019-08-23: qty 1

## 2019-08-23 MED ORDER — BUPIVACAINE LIPOSOME 1.3 % IJ SUSP
INTRAMUSCULAR | Status: DC | PRN
  Administered 2019-08-23: 20 mL

## 2019-08-23 MED ORDER — KETAMINE HCL 10 MG/ML IJ SOLN
INTRAMUSCULAR | Status: AC
  Filled 2019-08-23: qty 1

## 2019-08-23 MED ORDER — SUGAMMADEX SODIUM 200 MG/2ML IV SOLN
INTRAVENOUS | Status: DC | PRN
  Administered 2019-08-23: 200 mg via INTRAVENOUS

## 2019-08-23 MED ORDER — DEXAMETHASONE SODIUM PHOSPHATE 10 MG/ML IJ SOLN
INTRAMUSCULAR | Status: AC
  Filled 2019-08-23: qty 1

## 2019-08-23 MED ORDER — BUPIVACAINE LIPOSOME 1.3 % IJ SUSP
20.0000 mL | Freq: Once | INTRAMUSCULAR | Status: DC
  Filled 2019-08-23: qty 20

## 2019-08-23 MED ORDER — KETOROLAC TROMETHAMINE 30 MG/ML IJ SOLN
INTRAMUSCULAR | Status: AC
  Filled 2019-08-23: qty 1

## 2019-08-23 MED ORDER — LIDOCAINE 2% (20 MG/ML) 5 ML SYRINGE
INTRAMUSCULAR | Status: DC | PRN
  Administered 2019-08-23: 80 mg via INTRAVENOUS

## 2019-08-23 MED ORDER — ONDANSETRON HCL 4 MG PO TABS: 4.0000 mg | ORAL_TABLET | Freq: Three times a day (TID) | ORAL | 3 refills | Status: DC | PRN

## 2019-08-23 MED ORDER — LACTATED RINGERS IV SOLN
INTRAVENOUS | Status: DC
  Filled 2019-08-23: qty 1000

## 2019-08-23 SURGICAL SUPPLY — 41 items
APPLIER CLIP 5 13 M/L LIGAMAX5 (MISCELLANEOUS) ×2
CABLE HIGH FREQUENCY MONO STRZ (ELECTRODE) ×2 IMPLANT
CHLORAPREP W/TINT 26 (MISCELLANEOUS) ×2 IMPLANT
CLIP APPLIE 5 13 M/L LIGAMAX5 (MISCELLANEOUS) ×1 IMPLANT
CONT SPEC 4OZ CLIKSEAL STRL BL (MISCELLANEOUS) IMPLANT
COVER MAYO STAND STRL (DRAPES) IMPLANT
COVER WAND RF STERILE (DRAPES) ×2 IMPLANT
DECANTER SPIKE VIAL GLASS SM (MISCELLANEOUS) ×2 IMPLANT
DERMABOND ADVANCED (GAUZE/BANDAGES/DRESSINGS) ×1
DERMABOND ADVANCED .7 DNX12 (GAUZE/BANDAGES/DRESSINGS) IMPLANT
DRAIN CHANNEL 19F RND (DRAIN) IMPLANT
DRAPE C-ARM 42X72 X-RAY (DRAPES) ×2 IMPLANT
DRAPE WARM FLUID 44X44 (DRAPES) ×2 IMPLANT
DRSG TEGADERM 4X4.75 (GAUZE/BANDAGES/DRESSINGS) ×2 IMPLANT
ELECT REM PT RETURN 9FT ADLT (ELECTROSURGICAL) ×2
ELECTRODE REM PT RTRN 9FT ADLT (ELECTROSURGICAL) ×1 IMPLANT
ENDOLOOP SUT PDS II  0 18 (SUTURE)
ENDOLOOP SUT PDS II 0 18 (SUTURE) IMPLANT
EVACUATOR SILICONE 100CC (DRAIN) IMPLANT
GLOVE ECLIPSE 8.0 STRL XLNG CF (GLOVE) ×2 IMPLANT
GLOVE INDICATOR 8.0 STRL GRN (GLOVE) ×2 IMPLANT
GOWN STRL REUS W/TWL XL LVL3 (GOWN DISPOSABLE) ×2 IMPLANT
IRRIG SUCT STRYKERFLOW 2 WTIP (MISCELLANEOUS) ×2
IRRIGATION SUCT STRKRFLW 2 WTP (MISCELLANEOUS) ×1 IMPLANT
PACK BASIN DAY SURGERY FS (CUSTOM PROCEDURE TRAY) ×2 IMPLANT
PAD POSITIONING PINK XL (MISCELLANEOUS) ×2 IMPLANT
POUCH RETRIEVAL ECOSAC 10 (ENDOMECHANICALS) IMPLANT
POUCH RETRIEVAL ECOSAC 10MM (ENDOMECHANICALS) ×1
SCISSORS LAP 5X35 DISP (ENDOMECHANICALS) ×2 IMPLANT
SET CHOLANGIOGRAPH MIX (MISCELLANEOUS) ×2 IMPLANT
SET TUBE SMOKE EVAC HIGH FLOW (TUBING) ×2 IMPLANT
SHEARS HARMONIC ACE PLUS 36CM (ENDOMECHANICALS) ×2 IMPLANT
SPONGE GAUZE 2X2 8PLY STRL LF (GAUZE/BANDAGES/DRESSINGS) ×2 IMPLANT
SUT MNCRL AB 4-0 PS2 18 (SUTURE) ×2 IMPLANT
SUT PDS AB 1 CT1 27 (SUTURE) ×4 IMPLANT
SUT VIC AB 2-0 UR6 27 (SUTURE) IMPLANT
SYRINGE 20CC LL (MISCELLANEOUS) IMPLANT
TOWEL OR 17X26 10 PK STRL BLUE (TOWEL DISPOSABLE) ×2 IMPLANT
TRAY LAPAROSCOPIC (CUSTOM PROCEDURE TRAY) ×2 IMPLANT
TROCAR 5M 150ML BLDLS (TROCAR) ×2 IMPLANT
TROCAR Z-THREAD FIOS 5X100MM (TROCAR) ×2 IMPLANT

## 2019-08-23 NOTE — Anesthesia Preprocedure Evaluation (Signed)
Anesthesia Evaluation  Patient identified by MRN, date of birth, ID band Patient awake    Reviewed: Allergy & Precautions, NPO status , Patient's Chart, lab work & pertinent test results  Airway Mallampati: II  TM Distance: >3 FB Neck ROM: Full    Dental  (+) Dental Advisory Given   Pulmonary asthma ,    breath sounds clear to auscultation       Cardiovascular negative cardio ROS   Rhythm:Regular Rate:Normal     Neuro/Psych negative neurological ROS     GI/Hepatic Neg liver ROS, GERD  ,  Endo/Other  negative endocrine ROS  Renal/GU negative Renal ROS     Musculoskeletal   Abdominal   Peds  Hematology negative hematology ROS (+)   Anesthesia Other Findings   Reproductive/Obstetrics                             Anesthesia Physical Anesthesia Plan  ASA: II  Anesthesia Plan: General   Post-op Pain Management:    Induction: Intravenous  PONV Risk Score and Plan: 3 and Midazolam, Dexamethasone, Ondansetron and Treatment may vary due to age or medical condition  Airway Management Planned: Oral ETT  Additional Equipment: None  Intra-op Plan:   Post-operative Plan: Extubation in OR  Informed Consent: I have reviewed the patients History and Physical, chart, labs and discussed the procedure including the risks, benefits and alternatives for the proposed anesthesia with the patient or authorized representative who has indicated his/her understanding and acceptance.     Dental advisory given  Plan Discussed with: CRNA  Anesthesia Plan Comments:         Anesthesia Quick Evaluation

## 2019-08-23 NOTE — Interval H&P Note (Signed)
History and Physical Interval Note:  08/23/2019 10:24 AM  Christina Gilbert  has presented today for surgery, with the diagnosis of SYMPTOMATIC BILIARY COLIC, PROBABLE CHRONIC CHOLECYSTITIS.  The various methods of treatment have been discussed with the patient and family. After consideration of risks, benefits and other options for treatment, the patient has consented to  Procedure(s): [place] CHOLANGIOGRAM (N/A) as a surgical intervention.  The patient's history has been reviewed, patient examined, no change in status, stable for surgery.  I have reviewed the patient's chart and labs.  Questions were answered to the patient's satisfaction.    I have re-reviewed the the patient's records, history, medications, and allergies.  I have re-examined the patient.  I again discussed intraoperative plans and goals of post-operative recovery.  The patient agrees to proceed.  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   Morbid obesity ([place]) 03/30/2017   Indication for care in labor or delivery 02/15/2017   Trichomonas contact 11/06/2016   Chronic hypertension affecting pregnancy 09/13/2015   Susceptible to varicella (non-immune), currently pregnant 03/25/2015   Asthma, well controlled 06/15/2013   Obesity affecting pregnancy in second trimester 06/12/2013   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    Past Medical History:  Diagnosis Date   Anxiety    with pregnancy    Asthma    last used inhaler at age 75   Constipation     chronic constipation   Depression    with pregnancy    GERD (gastroesophageal reflux disease)    Past hx with pregnancies    HSV infection    Non Hodgkin's lymphoma ([place])    at age 75   NVD (normal vaginal delivery)    x 3   Pregnancy induced hypertension     Past Surgical History:  Procedure Laterality Date   LYMPH GLAND EXCISION     NSVD     x 3    TONSILLECTOMY     WISDOM TOOTH EXTRACTION      Social History   Socioeconomic History   Marital status: Single    Spouse name: Not on file   Number of children: 3   Years of education: Not on file   Highest education level: Not on file  Occupational History   Occupation: CNA  Tobacco Use   Smoking status: Never Smoker   Smokeless tobacco: Never Used  Substance and Sexual Activity   Alcohol use: Not Currently   Drug use: Never   Sexual activity: Not on file  Other Topics Concern   Not on file  Social History Narrative   Not on file   Social Determinants of Health   Financial Resource Strain:    Difficulty of Paying Living Expenses: Not on file  Food Insecurity:    Worried About [place] in the Last Year: Not on file   [employer] of Food in the Last Year: Not on file  Transportation Needs:    Lack of Transportation (Medical): Not on file   Lack of Transportation (Non-Medical): Not on file  Physical Activity:  Days of Exercise per Week: Not on file   Minutes of Exercise per Session: Not on file  Stress:    Feeling of Stress : Not on file  Social Connections:    Frequency of Communication with Friends and Family: Not on file   Frequency of Social Gatherings with Friends and Family: Not on file   Attends Religious Services: Not on file   Active Member of Clubs or Organizations: Not on file   Attends Archivist Meetings: Not on file   Marital Status: Not on file  Intimate Partner Violence:    Fear of Current or Ex-Partner: Not on file   Emotionally Abused: Not on file   Physically  Abused: Not on file   Sexually Abused: Not on file    Family History  Problem Relation Age of Onset   Diabetes Mother    Hypertension Mother    Diabetes Father    Hypertension Father    Cholelithiasis Maternal Grandfather    Colon cancer Neg Hx    Colon polyps Neg Hx    Esophageal cancer Neg Hx    Rectal cancer Neg Hx    Stomach cancer Neg Hx     No medications prior to admission.    Current Facility-Administered Medications  Medication Dose Route Frequency Provider Last Rate Last Admin   bupivacaine liposome (EXPAREL) 1.3 % injection 266 mg  20 mL Infiltration Once Michael Boston, MD       Chlorhexidine Gluconate Cloth 2 % PADS 6 each  6 each Topical Once Michael Boston, MD       And   Chlorhexidine Gluconate Cloth 2 % PADS 6 each  6 each Topical Once Michael Boston, MD       lactated ringers infusion   Intravenous Continuous Myrtie Soman, MD 50 mL/hr at 08/23/19 0847 New Bag at 08/23/19 0847     No Known Allergies  BP (!) 148/85   Pulse 99   Temp 98 F (36.7 C) (Oral)   Resp 18   Ht 5\' 7"  (1.702 m)   Wt 101.9 kg   SpO2 100%   BMI 35.19 kg/m   Labs: Results for orders placed or performed during the hospital encounter of 08/23/19 (from the past 48 hour(s))  Pregnancy, urine POC     Status: None   Collection Time: 08/23/19  8:15 AM  Result Value Ref Range   Preg Test, Ur NEGATIVE NEGATIVE    Comment:        THE SENSITIVITY OF THIS METHODOLOGY IS >24 mIU/mL     Imaging / Studies: US Abdomen Limited RUQ  Result Date: 08/08/2019 CLINICAL DATA:  Abdominal pain EXAM: ULTRASOUND ABDOMEN LIMITED RIGHT UPPER QUADRANT COMPARISON:  CT abdomen and pelvis Dec 16, 2018 FINDINGS: Gallbladder: Gallbladder is filled with calculi. Largest individual calculus measures 1.3 cm. A 3 mm calculus is likely adherent in the neck of the gallbladder. There is no appreciable gallbladder wall thickening or pericholecystic fluid. No sonographic Murphy sign noted by sonographer. Common bile  duct: Diameter: 5 mm. No intrahepatic or extrahepatic biliary duct dilatation. Liver: No focal lesion identified. Within normal limits in parenchymal echogenicity. Portal vein is patent on color Doppler imaging with normal direction of blood flow towards the liver. Other: None. IMPRESSION: 1. Gallstones noted throughout the gallbladder. Questionable small calculus adherent in the neck of the gallbladder. This circumstance may warrant correlation with nuclear medicine hepatobiliary imaging study to assess for cystic duct patency. Note that the gallbladder wall does not  appear appreciably thickened, and no pericholecystic fluid is evident on this study. 2.  Study otherwise unremarkable. These results will be called to the ordering clinician or representative by the Radiologist Assistant, and communication documented in the PACS or zVision Dashboard. Electronically Signed   By: Lowella Grip III M.D.   On: 08/08/2019 10:43     .[name], M.D., F.A.C.S. Gastrointestinal and Minimally Invasive Surgery [facility] [address], [place], [place] [address] [phone] Main / Paging  08/23/2019 10:25 AM    [name]

## 2019-08-23 NOTE — Anesthesia Postprocedure Evaluation (Signed)
Anesthesia Post Note  Patient: Sports administrator  Procedure(s) Performed: LAPAROSCOPIC CHOLECYSTECTOMY SINGLE SITE, lysis of adhesions (N/A Abdomen)     Patient location during evaluation: PACU Anesthesia Type: General Level of consciousness: awake and alert Pain management: pain level controlled Vital Signs Assessment: post-procedure vital signs reviewed and stable Respiratory status: spontaneous breathing, nonlabored ventilation, respiratory function stable and patient connected to nasal cannula oxygen Cardiovascular status: blood pressure returned to baseline and stable Postop Assessment: no apparent nausea or vomiting Anesthetic complications: no    Last Vitals:  Vitals:   08/23/19 1348 08/23/19 1449  BP:  134/80  Pulse: 80 66  Resp: 16 20  Temp: 37.1 C   SpO2: 100% 100%    Last Pain:  Vitals:   08/23/19 1348  TempSrc: Oral  PainSc:                  Tiajuana Amass

## 2019-08-23 NOTE — Transfer of Care (Signed)
  Post vital signs:  Last Vitals:  Vitals Value Taken Time  BP    Temp    Pulse 103 08/23/19 1220  Resp 18 08/23/19 1220  SpO2 100 % 08/23/19 1220  Vitals shown include unvalidated device data.  Last Pain:  Vitals:   08/23/19 0836  TempSrc: Oral  PainSc: 7       Patients Stated Pain Goal: 6 (08/23/19 0836)  Immediate Anesthesia Transfer of Care Note  Patient: Christina Gilbert  Procedure(s) Performed: Procedure(s) (LRB): LAPAROSCOPIC CHOLECYSTECTOMY SINGLE SITE, lysis of adhesions (N/A)  Patient Location: PACU  Anesthesia Type: General  Level of Consciousness: awake, alert  and oriented  Airway & Oxygen Therapy: Patient Spontanous Breathing and Patient connected to nasal cannula oxygen  Post-op Assessment: Report given to PACU RN and Post -op Vital signs reviewed and stable  Post vital signs: Reviewed and stable  Complications: No apparent anesthesia complications

## 2019-08-23 NOTE — Op Note (Signed)
08/23/2019  PATIENT:  Christina Gilbert  24 y.o. female  Patient Care Team: [place], [place] as PCP - General (Internal Medicine) Michael Boston, MD as Consulting Physician (General Surgery) Pyrtle, Lajuan Lines, MD as Consulting Physician (Gastroenterology)  PRE-OPERATIVE DIAGNOSIS:    Chronic Calculus cholecystitis  POST-OPERATIVE DIAGNOSIS:   Chronic Calculus cholecystitis Fitz-Hugh-Curtis Syndrome   PROCEDURE:  SINGLE SITE Laparoscopic cholecystectomy  Laparoscopic lysis of adhesions  SURGEON:  [name], MD, FACS.  ASSISTANT: OR Staff   ANESTHESIA:    General with endotracheal intubation Local anesthetic as a field block  EBL:  (See Anesthesia Intraoperative Record) Total I/O In: 700 [I.V.:700] Out: 20 [Blood:20]  Delay start of Pharmacological VTE agent (>24hrs) due to surgical blood loss or risk of bleeding:  no  DRAINS: None   SPECIMEN: Gallbladder    DISPOSITION OF SPECIMEN:  PATHOLOGY  COUNTS:  YES  PLAN OF CARE: Discharge to home after PACU  PATIENT DISPOSITION:  PACU - hemodynamically stable.  INDICATION: Pleasant young woman with intermittent episodes of upper abdominal pain and nausea vomiting with gallstones.  Seem consistent with biliary colic and now chronic cholecystitis.  Worsening and accelerating attacks.  Rest of the differential diagnosis seems unlikely.  I recommended laparoscopic cholecystectomy  The anatomy & physiology of hepatobiliary & pancreatic function was discussed.  The pathophysiology of gallbladder dysfunction was discussed.  Natural history risks without surgery was discussed.   I feel the risks of no intervention will lead to serious problems that outweigh the operative risks; therefore, I recommended cholecystectomy to remove the pathology.  I explained laparoscopic techniques with possible need for an open approach.  Probable cholangiogram to evaluate the bilary tract was explained as well.    Risks such as bleeding,  infection, abscess, leak, injury to other organs, need for further treatment, heart attack, death, and other risks were discussed.  I noted a good likelihood this will help address the problem.  Possibility that this will not correct all abdominal symptoms was explained.  Goals of post-operative recovery were discussed as well.  We will work to minimize complications.  An educational handout further explaining the pathology and treatment options was given as well.  Questions were answered.  The patient expresses understanding & wishes to proceed with surgery.  OR FINDINGS: Moderate adhesions of both lobes of the liver to the parietal peritoneum consistent with Fitz-Hugh Curtis syndrome.  Chronic thickening and adhesions to gallbladder consistent with chronic cholecystitis.  Numerous stone.  Largest 3 cm spherical impacted in the infundibulum.  Liver: normal  DESCRIPTION:   The patient was identified & brought in the operating room. The patient was positioned supine with arms tucked. SCDs were active during the entire case. The patient underwent general anesthesia without any difficulty.  The abdomen was prepped and draped in a sterile fashion. A Surgical Timeout confirmed our plan.  I made a transverse curvilinear incision through the superior umbilical fold.  I placed a 41mm long port through the supraumbilical fascia using a modified Hassan cutdown technique with umbilical stalk fascial countertraction. I began carbon dioxide insufflation.  No change in end tidal CO2 measurement.   Camera inspection revealed no injury.  There are a few wispy omental adhesions that I was able to free off with camera easily.  There were no adhesions to the anterior abdominal wall supraumbilically.  I proceeded to continue with single site technique. I placed a #5 port in left upper aspect of the wound. I placed a 5 mm atraumatic  grasper in the right inferior aspect of the wound.  I turned attention to the right upper  quadrant.  Dense but thin clear adhesions to both lobes of the liver consistent with Fitz-Hugh Curtis syndrome.  I used harmonic to transect all these off the anterior surface and dome of the liver on both sides.  This allowed greater liver mobility.  The gallbladder fundus was elevated cephalad. I freed adhesions to the ventral surface of the gallbladder off carefully.  I freed the peritoneal coverings between the gallbladder and the liver on the posteriolateral and anteriomedial walls. I alternated between Harmonic & blunt [place] dissection to help get a good critical view of the cystic artery and cystic duct.  did further dissection to free 80%of the gallbladder off the liver bed to get a good critical view of the infundibulum and cystic duct. I dissected out the cystic artery; and, after getting a good 360 view, ligated the anterior & posterior branches of the cystic artery close on the infundibulum using the Harmonic ultrasonic dissection.  I skeletonized the cystic duct.  I placed a clip on the infundibulum. I did a partial cystic duct-otomy and ensured patency. I placed a 5 [place] cholangiocatheter through a puncture site at the right subcostal ridge of the abdominal wall.  However as I went to pass the catheter into the very helical cystic duct it began to avulse more and became very thin.  I decided to hold off on cholangiogram.  I removed the cholangiocatheter. I placed clips on the cystic duct x2.   I completed cystic duct transection.  I further ligated the cystic duct stump with 0 PDS Endoloop to good result.  I freed the gallbladder from its remaining attachments to the liver. I ensured hemostasis on the gallbladder fossa of the liver and elsewhere. I inspected the rest of the abdomen & detected no injury nor bleeding elsewhere.  I placed the gallbladder inside and ecosac and removed the gallbladder out the supraumbilical fascia.  I had to open up the fascia 25 mm to get the gallbladder with its  large stones out.  I closed the fascia transversely using #1 PDS interrupted stitches. I closed the skin using 4-0 monocryl stitch.  Sterile dressing was applied. The patient was extubated & arrived in the PACU in stable condition..  I had discussed postoperative care with the patient in the holding area. I discussed operative findings, updated the patient's status, discussed probable steps to recovery, and gave postoperative recommendations to the patient's mother.  Recommendations were made.  Questions were answered.  She expressed understanding & appreciation.  [name], M.D., F.A.C.S. Gastrointestinal and Minimally Invasive Surgery [facility] [address] [place],  [id number] [phone] Main / Paging  08/23/2019 12:20 PM

## 2019-08-23 NOTE — Anesthesia Procedure Notes (Signed)
Procedure Name: Intubation Date/Time: 08/23/2019 10:46 AM Performed by: Mechele Claude, CRNA Pre-anesthesia Checklist: Patient identified, Emergency Drugs available, Suction available and Patient being monitored Patient Re-evaluated:Patient Re-evaluated prior to induction Oxygen Delivery Method: Circle system utilized Preoxygenation: Pre-oxygenation with 100% oxygen Induction Type: IV induction Ventilation: Mask ventilation without difficulty Laryngoscope Size: Mac and 3 Grade View: Grade I Tube type: Oral Tube size: 7.0 mm Number of attempts: 1 Airway Equipment and Method: Stylet Placement Confirmation: ETT inserted through vocal cords under direct vision,  positive ETCO2 and breath sounds checked- equal and bilateral Secured at: 21 (at teeth) cm Tube secured with: Tape Dental Injury: Teeth and Oropharynx as per pre-operative assessment

## 2019-08-23 NOTE — OR Nursing (Signed)
Pt wild upon arrival to [facility].  Trying to sit up thrashing about trying to pull out iv required 3 nurses to calm patient.. orders received to give versed.  Darin Engels rn

## 2019-08-23 NOTE — Discharge Instructions (Signed)
Information for Discharge Teaching: EXPAREL (bupivacaine liposome injectable suspension)   Your surgeon gave you EXPAREL(bupivacaine) in your surgical incision to help control your pain after surgery.   EXPAREL is a local anesthetic that provides pain relief by numbing the tissue around the surgical site.  EXPAREL is designed to release pain medication over time and can control pain for up to 72 hours.  Depending on how you respond to EXPAREL, you may require less pain medication during your recovery.  Possible side effects:  Temporary loss of sensation or ability to move in the area where bupivacaine was injected.  Nausea, vomiting, constipation  Rarely, numbness and tingling in your mouth or lips, lightheadedness, or anxiety may occur.  Call your doctor right away if you think you may be experiencing any of these sensations, or if you have other questions regarding possible side effects.  Follow all other discharge instructions given to you by your surgeon or nurse. Eat a healthy diet and drink plenty of water or other fluids.  If you return to the hospital for any reason within 96 hours following the administration of EXPAREL, please inform your health care providers. Post Anesthesia Home Care Instructions  Activity: Get plenty of rest for the remainder of the day. A responsible adult should stay with you for 24 hours following the procedure.  For the next 24 hours, DO NOT: -Drive a car -Paediatric nurse -Drink alcoholic beverages -Take any medication unless instructed by your physician -Make any legal decisions or sign important papers.  Meals: Start with liquid foods such as gelatin or soup. Progress to regular foods as tolerated. Avoid greasy, spicy, heavy foods. If nausea and/or vomiting occur, drink only clear liquids until the nausea and/or vomiting subsides. Call your physician if vomiting continues.  Special Instructions/Symptoms: Your throat may feel dry or sore  from the anesthesia or the breathing tube placed in your throat during surgery. If this causes discomfort, gargle with warm salt water. The discomfort should disappear within 24 hours.  If you had a scopolamine patch placed behind your ear for the management of post- operative nausea and/or vomiting:  1. The medication in the patch is effective for 72 hours, after which it should be removed.  Wrap patch in a tissue and discard in the trash. Wash hands thoroughly with soap and water. 2. You may remove the patch earlier than 72 hours if you experience unpleasant side effects which may include dry mouth, dizziness or visual disturbances. 3. Avoid touching the patch. Wash your hands with soap and water after contact with the patch.   LAPAROSCOPIC SURGERY: POST OP INSTRUCTIONS  ######################################################################  EAT Gradually transition to a high fiber diet with a fiber supplement over the next few weeks after discharge.  Start with a pureed / full liquid diet (see below)  WALK Walk an hour a day.  Control your pain to do that.    CONTROL PAIN Control pain so that you can walk, sleep, tolerate sneezing/coughing, go up/down stairs.  HAVE A BOWEL MOVEMENT DAILY Keep your bowels regular to avoid problems.  OK to try a laxative to override constipation.  OK to use an antidairrheal to slow down diarrhea.  Call if not better after 2 tries  CALL IF YOU HAVE PROBLEMS/CONCERNS Call if you are still struggling despite following these instructions. Call if you have concerns not answered by these instructions  ######################################################################    1. DIET: Follow a light bland diet & liquids the first 24 hours after arrival home,  such as soup, liquids, starches, etc.  Be sure to drink plenty of fluids.  Quickly advance to a usual solid diet within a few days.  Avoid fast food or heavy meals as your are more likely to get  nauseated or have irregular bowels.  A low-fat, high-fiber diet for the rest of your life is ideal.  2. Take your usually prescribed home medications unless otherwise directed.  3. PAIN CONTROL: a. Pain is best controlled by a usual combination of three different methods TOGETHER: i. Ice/Heat ii. Over the counter pain medication iii. Prescription pain medication b. Most patients will experience some swelling and bruising around the incisions.  Ice packs or heating pads (30-60 minutes up to 6 times a day) will help. Use ice for the first few days to help decrease swelling and bruising, then switch to heat to help relax tight/sore spots and speed recovery.  Some people prefer to use ice alone, heat alone, alternating between ice & heat.  Experiment to what works for you.  Swelling and bruising can take several weeks to resolve.   c. It is helpful to take an over-the-counter pain medication regularly for the first few weeks.  Choose one of the following that works best for you: i. Naproxen (Aleve, etc)  Two 220mg  tabs twice a day ii. Ibuprofen (Advil, etc) Three 200mg  tabs four times a day (every meal & bedtime) iii. Acetaminophen (Tylenol, etc) 500-650mg  four times a day (every meal & bedtime) d. A  prescription for pain medication (such as oxycodone, hydrocodone, tramadol, gabapentin, methocarbamol, etc) should be given to you upon discharge.  Take your pain medication as prescribed.  i. If you are having problems/concerns with the prescription medicine (does not control pain, nausea, vomiting, rash, itching, etc), please call us [phone] to see if we need to switch you to a different pain medicine that will work better for you and/or control your side effect better. ii. If you need a refill on your pain medication, please give [place] 48 hour notice.  contact your pharmacy.  They will contact our office to request authorization. Prescriptions will not be filled after 5 pm or on week-ends  4. Avoid  getting constipated.   a. Between the surgery and the pain medications, it is common to experience some constipation.   b. Increasing fluid intake and taking a fiber supplement (such as Metamucil, Citrucel, FiberCon, MiraLax, etc) 1-2 times a day regularly will usually help prevent this problem from occurring.   c. A mild laxative (prune juice, Milk of Magnesia, MiraLax, etc) should be taken according to package directions if there are no bowel movements after 48 hours.   5. Watch out for diarrhea.   a. If you have many loose bowel movements, simplify your diet to bland foods & liquids for a few days.   b. Stop any stool softeners and decrease your fiber supplement.   c. Switching to mild anti-diarrheal medications (Kayopectate, Pepto Bismol) can help.   d. If this worsens or does not improve, please call us.  6. Wash / shower every day.  You may shower over the dressings as they are waterproof.  Continue to shower over incision(s) after the dressing is off.  7. Remove your waterproof bandages 5 days after surgery.  You may leave the incision open to air.  You may replace a dressing/Band-Aid to cover the incision for comfort if you wish.   8. ACTIVITIES as tolerated:   a. You may resume regular (light) daily  activities beginning the next day--such as daily self-care, walking, climbing stairs--gradually increasing activities as tolerated.  If you can walk 30 minutes without difficulty, it is safe to try more intense activity such as jogging, treadmill, bicycling, low-impact aerobics, swimming, etc. b. Save the most intensive and strenuous activity for last such as sit-ups, heavy lifting, contact sports, etc  Refrain from any heavy lifting or straining until you are off narcotics for pain control.   c. DO NOT PUSH THROUGH PAIN.  Let pain be your guide: If it hurts to do something, don't do it.  Pain is your body warning you to avoid that activity for another week until the pain goes down. d. You may  drive when you are no longer taking prescription pain medication, you can comfortably wear a seatbelt, and you can safely maneuver your car and apply brakes. e. Dennis Bast may have sexual intercourse when it is comfortable.  9. FOLLOW UP in our office a. Please call [facility] at [phone] to set up an appointment to see your surgeon in the office for a follow-up appointment approximately 2-3 weeks after your surgery. b. Make sure that you call for this appointment the day you arrive home to insure a convenient appointment time.  10. IF YOU HAVE DISABILITY OR FAMILY LEAVE FORMS, BRING THEM TO THE OFFICE FOR PROCESSING.  DO NOT GIVE THEM TO YOUR DOCTOR.   WHEN TO CALL us [phone]: 1. Poor pain control 2. Reactions / problems with new medications (rash/itching, nausea, etc)  3. Fever over 101.5 F (38.5 C) 4. Inability to urinate 5. Nausea and/or vomiting 6. Worsening swelling or bruising 7. Continued bleeding from incision. 8. Increased pain, redness, or drainage from the incision   The clinic staff is available to answer your questions during regular business hours (8:30am-5pm).  Please don't hesitate to call and ask to speak to one of our nurses for clinical concerns.   If you have a medical emergency, go to the nearest emergency room or call 911.  A surgeon from [facility] is always on call at the [facility], [place], [place], [place], [place]  [address] ? MAIN: [phone] ? TOLL FREE: [phone] ?  FAX [phone] [email or web address]   Cholecystitis  Cholecystitis is inflammation of the gallbladder. It is often called a gallbladder attack. The gallbladder is a pear-shaped organ that lies beneath the liver on the right side of the body. The gallbladder stores bile, which is a fluid that helps the body digest fats. If bile builds up in your gallbladder, your gallbladder becomes inflamed. This condition may  occur suddenly. Cholecystitis is a serious condition and requires treatment. What are the causes? The most common cause of this condition is gallstones. Gallstones can block the tube (duct) that carries bile out of your gallbladder. This causes bile to build up. Other causes include:  Damage to the gallbladder due to a decrease in blood flow.  Infections in the bile ducts.  Scars or kinks in the bile ducts.  Tumors in the liver, pancreas, or gallbladder. What increases the risk? You are more likely to develop this condition if:  You have sickle cell disease.  You take birth control pills or use estrogen.  You have alcoholic liver disease.  You have liver cirrhosis.  You have your nutrition delivered through a vein (parenteral nutrition).  You are critically ill.  You do not eat or drink for a long time. This  is also called "fasting."  You are obese.  You lose weight too fast.  You are pregnant.  You have high levels of fat (triglycerides) in the blood.  You have pancreatitis. What are the signs or symptoms? Symptoms of this condition include:  Pain in the abdomen, especially in the upper right area of the abdomen.  Tenderness or bloating in the abdomen.  Nausea.  Vomiting.  Fever.  Chills. How is this diagnosed? This condition is diagnosed with a medical history and physical exam. You may also have other tests, including:  Imaging tests, such as: ? An ultrasound of the gallbladder. ? A CT scan of the abdomen. ? A gallbladder nuclear scan (HIDA scan). This scan allows your health care provider to see the bile moving from your liver to your gallbladder and on to your small intestine. ? MRI.  Blood tests, such as: ? A complete blood count. The white blood cell count may be higher than normal. ? Liver function tests. Certain types of gallstones cause some results to be higher than normal. How is this treated? Treatment may include:  Surgery to remove  your gallbladder (cholecystectomy).  Antibiotic medicine, usually through an IV.  Fasting for a certain amount of time.  Giving IV fluids.  Medicine to treat pain or vomiting. Follow these instructions at home:  If you had surgery, follow instructions from your health care provider about home care after the procedure. Medicines   Take over-the-counter and prescription medicines only as told by your health care provider.  If you were prescribed an antibiotic medicine, take it as told by your health care provider. Do not stop taking the antibiotic even if you start to feel better. General instructions  Follow instructions from your health care provider about what to eat or drink. When you are allowed to eat, avoid eating or drinking anything that triggers your symptoms.  Do not lift anything that is heavier than 10 lb (4.5 kg), or the limit that you are told, until your health care provider says that it is safe.  Do not use any products that contain nicotine or tobacco, such as cigarettes and e-cigarettes. If you need help quitting, ask your health care provider.  Keep all follow-up visits as told by your health care provider. This is important. Contact a health care provider if:  Your pain is not controlled with medicine.  You have a fever. Get help right away if:  Your pain moves to another part of your abdomen or to your back.  You continue to have symptoms or you develop new symptoms even with treatment. Summary  Cholecystitis is inflammation of the gallbladder.  The most common cause of this condition is gallstones. Gallstones can block the tube (duct) that carries bile out of your gallbladder.  Common symptoms are pain in the abdomen, nausea, vomiting, fever, and chills.  This condition is treated with surgery to remove the gallbladder, medicines, fasting, and IV fluids.  Follow your health care provider's instructions for eating and drinking. Avoid eating anything  that triggers your symptoms. This information is not intended to replace advice given to you by your health care provider. Make sure you discuss any questions you have with your health care provider. Document Revised: 11/26/2017 Document Reviewed: 11/26/2017 Elsevier Patient Education  [place].

## 2019-08-24 LAB — SURGICAL PATHOLOGY

## 2020-02-08 IMAGING — US US ABDOMEN LIMITED
1 series · 13 of 25 positions shown · non-contrast
Comparison: CT abdomen and pelvis December 16, 2018

CLINICAL DATA: Abdominal pain

EXAM:
ULTRASOUND ABDOMEN LIMITED RIGHT UPPER QUADRANT

[Series 1: us abdomen limited · 13 of 70 slices shown]
[im 1/70]
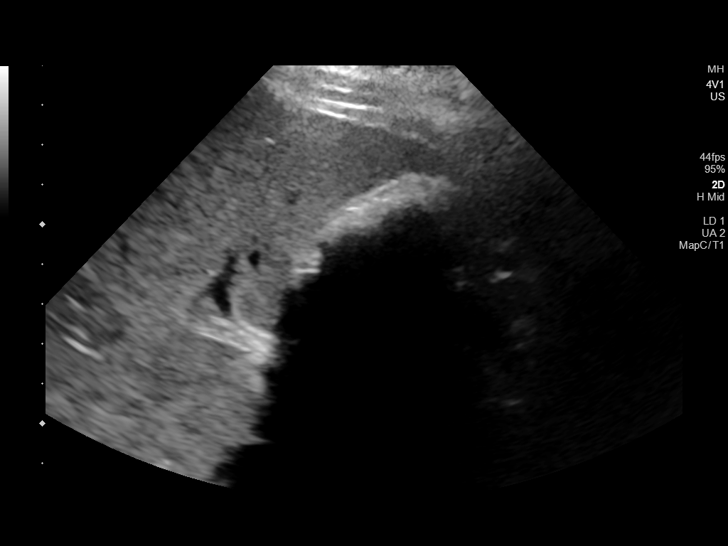
[im 6/70]
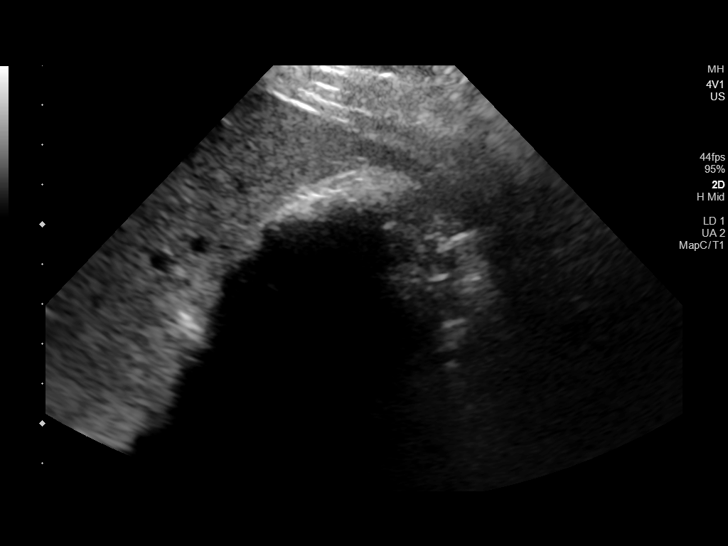
[im 12/70]
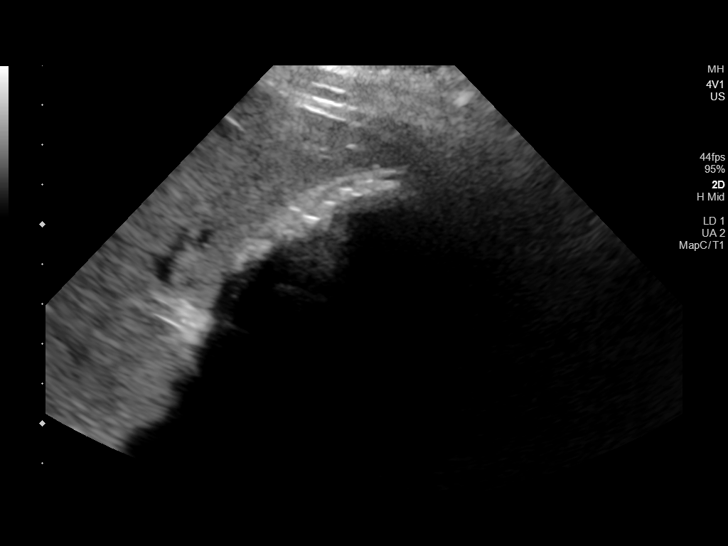
[im 18/70]
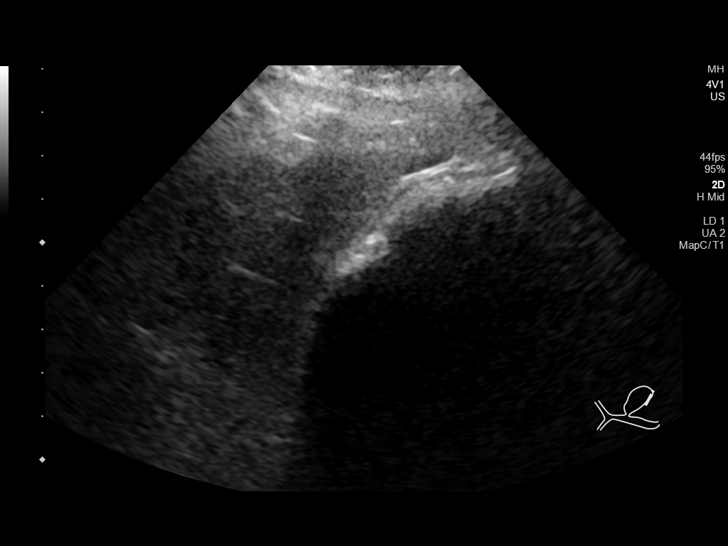
[im 24/70]
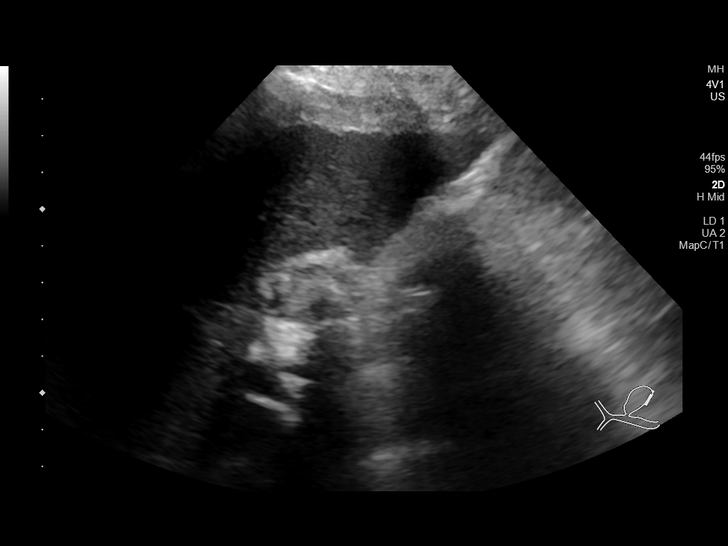
[im 29/70]
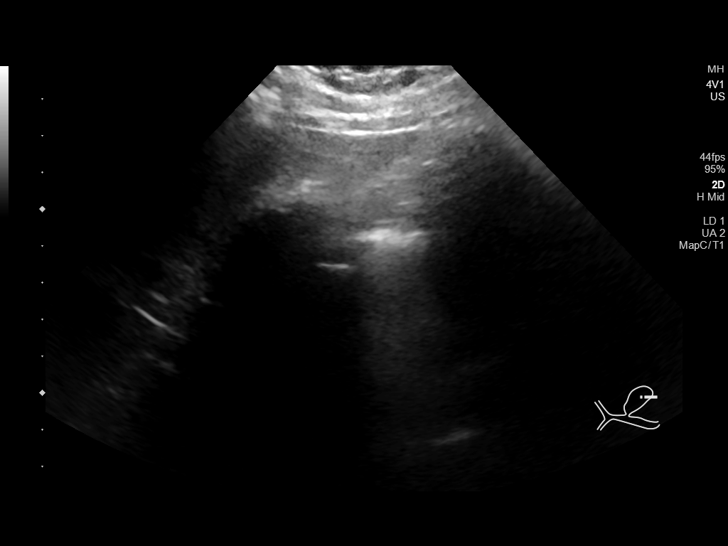
[im 35/70]
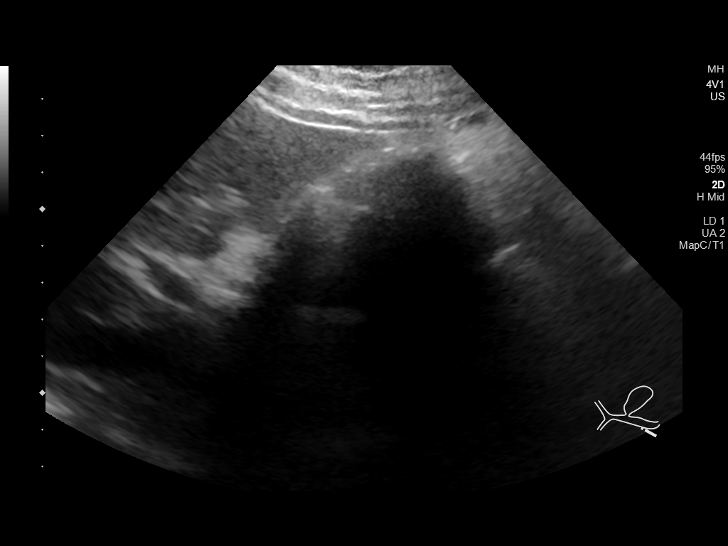
[im 41/70]
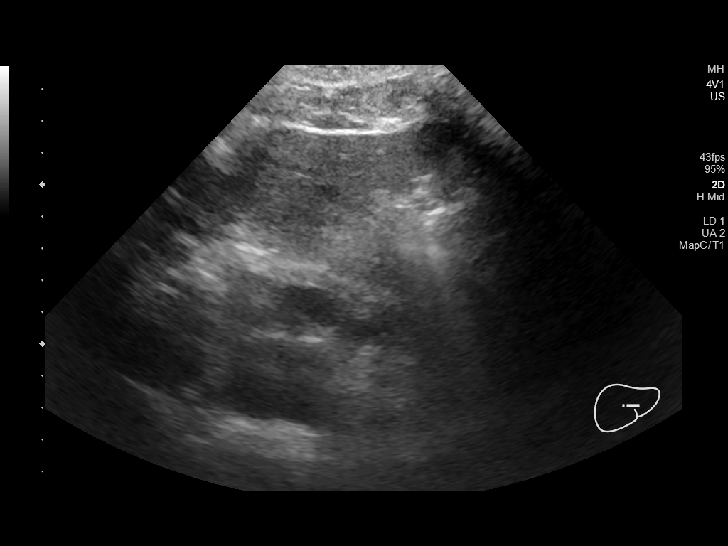
[im 47/70]
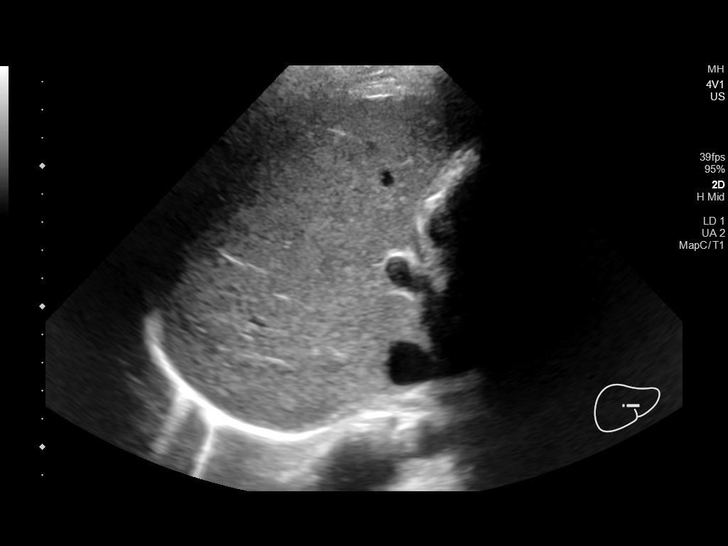
[im 52/70]
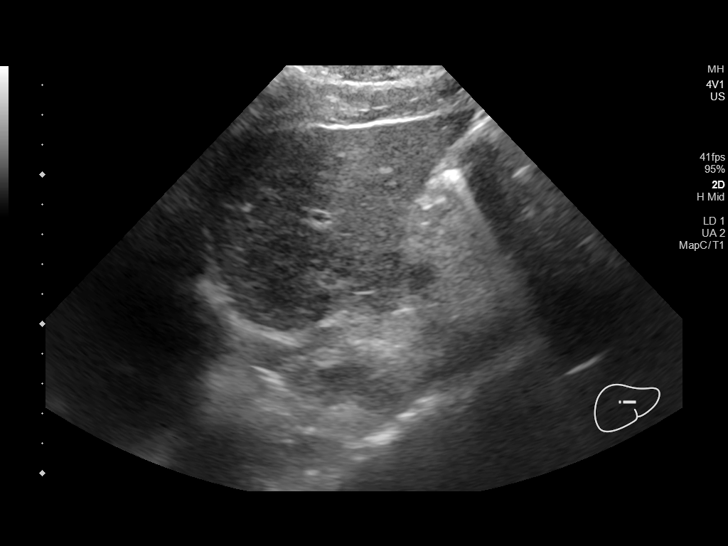
[im 58/70]
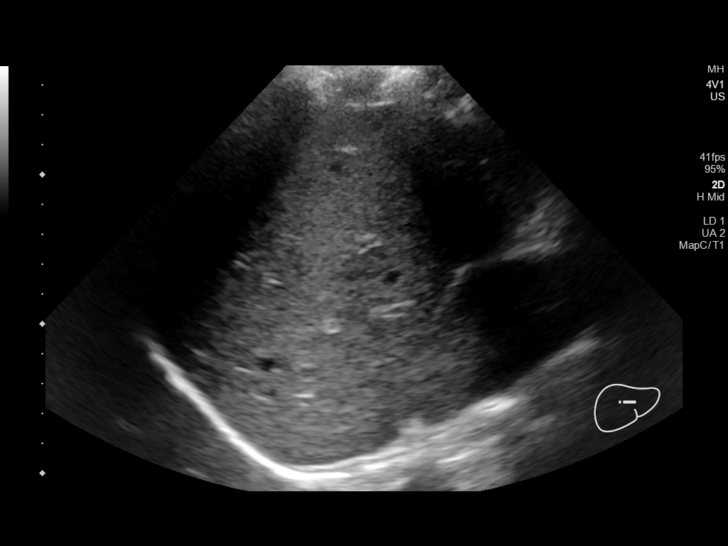
[im 64/70]
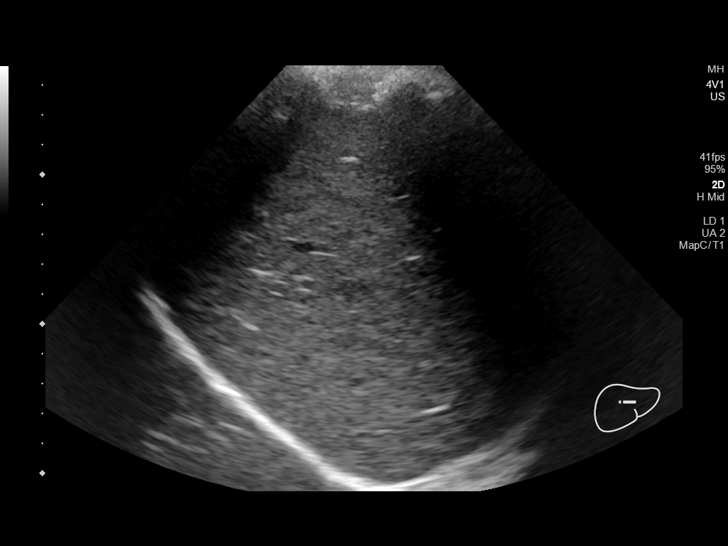
[im 70/70]
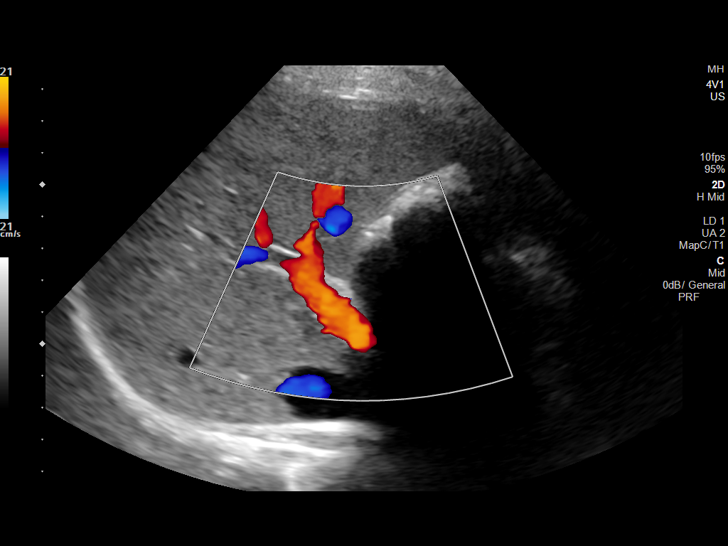

[13 of 25 positions shown; findings below may reference images not displayed]

FINDINGS: Gallbladder:

Gallbladder is filled with calculi. Largest individual calculus
measures 1.3 cm. A 3 mm calculus is likely adherent in the neck of
the gallbladder. There is no appreciable gallbladder wall thickening
or pericholecystic fluid. No sonographic Murphy sign noted by
sonographer.

Common bile duct:

Diameter: 5 mm. No intrahepatic or extrahepatic biliary duct
dilatation.

Liver:

No focal lesion identified. Within normal limits in parenchymal
echogenicity. Portal vein is patent on color Doppler imaging with
normal direction of blood flow towards the liver.

Other: None.
IMPRESSION: 1. Gallstones noted throughout the gallbladder. Questionable small
calculus adherent in the neck of the gallbladder. This circumstance
may warrant correlation with nuclear medicine hepatobiliary imaging
study to assess for cystic duct patency. Note that the gallbladder
wall does not appear appreciably thickened, and no pericholecystic
fluid is evident on this study.

2.  Study otherwise unremarkable.

These results will be called to the ordering clinician or
representative by the Radiologist Assistant, and communication
documented in the PACS or zVision Dashboard.

## 2020-02-27 ENCOUNTER — Ambulatory Visit: Payer: Medicaid Other | Admitting: Podiatry

## 2020-03-07 ENCOUNTER — Other Ambulatory Visit: Payer: Self-pay

## 2020-03-07 ENCOUNTER — Ambulatory Visit: Payer: Medicaid Other | Admitting: Podiatry

## 2020-03-07 DIAGNOSIS — M2141 Flat foot [pes planus] (acquired), right foot: Secondary | ICD-10-CM | POA: Diagnosis not present

## 2020-03-07 DIAGNOSIS — D492 Neoplasm of unspecified behavior of bone, soft tissue, and skin: Secondary | ICD-10-CM | POA: Diagnosis not present

## 2020-03-07 DIAGNOSIS — M2142 Flat foot [pes planus] (acquired), left foot: Secondary | ICD-10-CM | POA: Diagnosis not present

## 2020-03-07 DIAGNOSIS — M21619 Bunion of unspecified foot: Secondary | ICD-10-CM

## 2020-03-08 NOTE — Progress Notes (Signed)
Subjective: 24 year old female presents the office today for painful calluses with the left worse than the right.  She has been working on changing shoes on her feet a lot during the day which causes discomfort.  She would like to consider surgery but she is not sure about her time off of work. Denies any systemic complaints such as fevers, chills, nausea, vomiting. No acute changes since last appointment, and no other complaints at this time.   Objective: AAO x3, NAD DP/PT pulses palpable bilaterally, CRT less than 3 seconds Hyperkeratotic lesions bilateral plantar hallux, left submetatarsal 5, plantar heel.  There is no ongoing ulceration drainage or signs of infection. Bunion, flatfoot deformity is present.  No pain with calf compression, swelling, warmth, erythema  Assessment: 24 year old female with hyperkeratotic lesions likely due to digital deformity, flatfoot  Plan: -All treatment options discussed with the patient including all alternatives, risks, complications.  -I debrided the hyperkeratotic lesions as a courtesy x4 without any complications or bleeding.  Recommend moisturizer daily.  Again today we discussed surgical intervention.  She is uncomfortable with time off. -Patient encouraged to call the office with any questions, concerns, change in symptoms.   Trula Slade DPM

## 2020-06-16 ENCOUNTER — Other Ambulatory Visit: Payer: Self-pay

## 2020-06-16 ENCOUNTER — Emergency Department (HOSPITAL_COMMUNITY)
Admission: EM | Admit: 2020-06-16 | Discharge: 2020-06-16 | Disposition: A | Discharge status: Home / Self Care | Payer: Medicaid Other | Attending: Emergency Medicine | Admitting: Emergency Medicine

## 2020-06-16 ENCOUNTER — Emergency Department (HOSPITAL_COMMUNITY): Payer: Medicaid Other

## 2020-06-16 ENCOUNTER — Encounter (HOSPITAL_COMMUNITY): Payer: Self-pay | Admitting: Emergency Medicine

## 2020-06-16 DIAGNOSIS — U071 COVID-19: Secondary | ICD-10-CM | POA: Diagnosis not present

## 2020-06-16 DIAGNOSIS — J45909 Unspecified asthma, uncomplicated: Secondary | ICD-10-CM | POA: Diagnosis not present

## 2020-06-16 DIAGNOSIS — R059 Cough, unspecified: Secondary | ICD-10-CM | POA: Diagnosis present

## 2020-06-16 LAB — BASIC METABOLIC PANEL
Anion gap: 10 (ref 5–15)
BUN: 9 mg/dL (ref 6–20)
CO2: 22 mmol/L (ref 22–32)
Calcium: 8.9 mg/dL (ref 8.9–10.3)
Chloride: 105 mmol/L (ref 98–111)
Creatinine, Ser: 0.7 mg/dL (ref 0.44–1.00)
GFR, Estimated: >60 mL/min (ref >60)
Glucose, Bld: 102 mg/dL — ABNORMAL HIGH (ref 70–99)
Potassium: 4.1 mmol/L (ref 3.5–5.1)
Sodium: 137 mmol/L (ref 135–145)

## 2020-06-16 LAB — CBC
HCT: 43.9 % (ref 36.0–46.0)
Hemoglobin: 14.4 g/dL (ref 12.0–15.0)
MCH: 29.7 pg (ref 26.0–34.0)
MCHC: 32.8 g/dL (ref 30.0–36.0)
MCV: 90.5 fL (ref 80.0–100.0)
Platelets: 438 10*3/uL — ABNORMAL HIGH (ref 150–400)
RBC: 4.85 MIL/uL (ref 3.87–5.11)
RDW: 13.1 % (ref 11.5–15.5)
WBC: 10 10*3/uL (ref 4.0–10.5)
nRBC: 0 % (ref 0.0–0.2)

## 2020-06-16 LAB — I-STAT BETA HCG BLOOD, ED (MC, WL, AP ONLY): I-stat hCG, quantitative: <5 m[IU]/mL (ref <5)

## 2020-06-16 MED ORDER — ALBUTEROL SULFATE HFA 108 (90 BASE) MCG/ACT IN AERS
4.0000 | INHALATION_SPRAY | Freq: Once | RESPIRATORY_TRACT | Status: AC
  Administered 2020-06-16: 4 via RESPIRATORY_TRACT
  Filled 2020-06-16: qty 6.7

## 2020-06-16 MED ORDER — SODIUM CHLORIDE 0.9% FLUSH: 3.0000 mL | Freq: Once | INTRAVENOUS | Status: DC

## 2020-06-16 MED ORDER — IPRATROPIUM BROMIDE HFA 17 MCG/ACT IN AERS
2.0000 | INHALATION_SPRAY | Freq: Once | RESPIRATORY_TRACT | Status: AC
  Administered 2020-06-16: 2 via RESPIRATORY_TRACT
  Filled 2020-06-16: qty 12.9

## 2020-06-16 MED ORDER — ACETAMINOPHEN 500 MG PO TABS
1000.0000 mg | ORAL_TABLET | Freq: Once | ORAL | Status: AC
  Administered 2020-06-16: 1000 mg via ORAL
  Filled 2020-06-16: qty 2

## 2020-06-16 NOTE — Discharge Instructions (Addendum)
You're seen today for COVID-19, I want you to take the albuterol as directed.  Take your albuterol 2 puffs every 6 hours as needed for asthma he can take the Atrovent 2 puffs at night while you have the symptoms.  You can take Tylenol instructed on the bottle for pain.  Please stay hydrated and get plenty of rest.  Follow CDC guidelines, follow up with your primary care in the next couple of days, you can do a telehealth visit for this.  If you have any new worsening concerning symptoms such as worsening shortness of breath or trouble breathing please come back to the emergency department.  Please use the attached instructions.  Went to have your EKG repeated in about 2 weeks, there was some mild abnormality in your T waves, you can have your PCP check this.

## 2020-06-16 NOTE — ED Notes (Signed)
Pt's O2 remained at 100 while ambulating on room air.

## 2020-06-16 NOTE — ED Provider Notes (Signed)
[facility] EMERGENCY DEPARTMENT Provider Note   CSN: [id number] Arrival date & time: 06/16/20  2725     History Chief Complaint  Patient presents with  . Covid Positive    Christina Gilbert is a 24 y.o. female with pertinent past medical history of anxiety, asthma, that presents emergency department today for shortness of breath, cough headache and generalized weakness after finding out that she was Covid positive yesterday.  Patient also admits to myalgias. Patient states that she has been feeling ill since Wednesday. Has not been vaccinated against Covid. 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 Denies any other sick contacts. Patient states that she has asthma, is out of her asthma medication, states that she has shortness of breath when she has an asthma flare. Does not currently have any shortness of breath. No chest pain. No fevers, chills, neck pain, abdominal pain, nausea, vomiting. Patient does have diarrhea. Has been drinking plenty of Pedialyte, water and Gatorade. States that she has not taken anything for this besides Mucinex which has not helped.    HPI     Past Medical History:  Diagnosis Date  . Anxiety    with pregnancy   . Asthma    last used inhaler at age 74  . Constipation    chronic constipation  . Depression    with pregnancy   . GERD (gastroesophageal reflux disease)    Past hx with pregnancies   . HSV infection   . Non Hodgkin's lymphoma ([place])    at age 7  . NVD (normal vaginal delivery)    x 3  . Pregnancy induced hypertension     Patient Active Problem List   Diagnosis Date Noted  . Constipation, chronic 08/14/2019  . Gastritis 08/14/2019  . 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 Non Hodgkin's lymphoma ([place])   . 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 Morbid obesity ([place]) 03/30/2017  . Indication for care in labor or delivery 02/15/2017  . Trichomonas contact 11/06/2016  .  Chronic hypertension affecting pregnancy 09/13/2015  . Susceptible to varicella (non-immune), currently pregnant 03/25/2015  . Asthma, well controlled 06/15/2013  . Obesity affecting pregnancy in second trimester 06/12/2013  . 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 LAPAROSCOPIC CHOLECYSTECTOMY SINGLE SITE WITH INTRAOPERATIVE CHOLANGIOGRAM N/A 08/23/2019   Procedure: LAPAROSCOPIC CHOLECYSTECTOMY SINGLE SITE, lysis of adhesions;  Surgeon: Michael Boston, MD;  Location: [facility];  Service: General;  Laterality: N/A;  . LYMPH GLAND EXCISION    . NSVD     x 3   . TONSILLECTOMY    . WISDOM TOOTH EXTRACTION       OB History    Gravida  3   Para  3   Term  3   Preterm      AB      Living  3     SAB      TAB      Ectopic      Multiple  0   Live Births  3           Family History  Problem Relation Age of Onset  . Diabetes Mother   . Hypertension Mother   . Diabetes Father   . Hypertension Father   . Cholelithiasis Maternal Grandfather   . Colon cancer Neg Hx   . Colon polyps Neg Hx   . Esophageal  cancer Neg Hx   . Rectal cancer Neg Hx   . Stomach cancer Neg Hx     Social History   Tobacco Use  . Smoking status: Never Smoker  . Smokeless tobacco: Never Used  Vaping Use  . Vaping Use: Never used  Substance Use Topics  . Alcohol use: Not Currently  . Drug use: Never    Home Medications Prior to Admission medications   Medication Sig Start Date End Date Taking? Authorizing Provider  ondansetron (ZOFRAN) 4 MG tablet Take 1 tablet (4 mg total) by mouth every 8 (eight) hours as needed for nausea. 08/23/19   Michael Boston, MD  traMADol (ULTRAM) 50 MG tablet Take 1-2 tablets (50-100 mg total) by mouth every 6 (six) hours as needed for moderate pain or severe pain. 08/23/19   Michael Boston, MD    Allergies    Patient has no known allergies.  Review of Systems   Review of Systems   Constitutional: Negative for chills, diaphoresis, fatigue and fever.  HENT: Negative for congestion, sore throat and trouble swallowing.   Eyes: Negative for pain and visual disturbance.  Respiratory: Positive for cough, shortness of breath and wheezing.   Cardiovascular: Negative for chest pain, palpitations and leg swelling.  Gastrointestinal: Positive for diarrhea. Negative for abdominal distention, abdominal pain, nausea and vomiting.  Genitourinary: Negative for difficulty urinating.  Musculoskeletal: Negative for back pain, neck pain and neck stiffness.  Skin: Negative for pallor.  Neurological: Positive for headaches. Negative for dizziness, speech difficulty and weakness.  Psychiatric/Behavioral: Negative for confusion.    Physical Exam Updated Vital Signs BP (!) 147/88 (BP Location: Right Arm)   Pulse 75   Temp 98.1 F (36.7 C) (Oral)   Resp 19   Ht 5\' 6"  (1.676 m)   Wt 108.9 kg   SpO2 100%   BMI 38.74 kg/m   Physical Exam Constitutional:      General: She is not in acute distress.    Appearance: Normal appearance. She is not ill-appearing, toxic-appearing or diaphoretic.     Comments: Patient without acute respiratory stress.  Patient is sitting comfortably in bed, no tripoding, use of accessory muscles.  Patient is speaking to me in full sentences.  Handling secretions well.  HENT:     Head: Normocephalic and atraumatic.     Jaw: There is normal jaw occlusion. No trismus, swelling or malocclusion.     Nose: No congestion or rhinorrhea.     Right Sinus: No maxillary sinus tenderness or frontal sinus tenderness.     Left Sinus: No maxillary sinus tenderness or frontal sinus tenderness.     Mouth/Throat:     Mouth: Mucous membranes are moist. No oral lesions.     Dentition: Normal dentition.     Tongue: No lesions.     Palate: No mass and lesions.     Pharynx: Oropharynx is clear. Uvula midline. No pharyngeal swelling, oropharyngeal exudate, posterior oropharyngeal  erythema or uvula swelling.     Tonsils: No tonsillar exudate or tonsillar abscesses. 1+ on the right. 1+ on the left.  Eyes:     General: No visual field deficit or scleral icterus.       Right eye: No discharge.        Left eye: No discharge.     Extraocular Movements: Extraocular movements intact.     Conjunctiva/sclera: Conjunctivae normal.     Pupils: Pupils are equal, round, and reactive to light.  Cardiovascular:     Rate and  Rhythm: Normal rate and regular rhythm.     Pulses: Normal pulses.     Heart sounds: Normal heart sounds. No murmur heard.  No friction rub. No gallop.   Pulmonary:     Effort: Pulmonary effort is normal. No respiratory distress.     Breath sounds: No stridor. Wheezing (Mild expiratory) present. No rhonchi or rales.  Chest:     Chest wall: No tenderness.  Abdominal:     General: Abdomen is flat. Bowel sounds are normal. There is no distension.     Palpations: Abdomen is soft.     Tenderness: There is no abdominal tenderness. There is no right CVA tenderness, left CVA tenderness, guarding or rebound.  Musculoskeletal:        General: No swelling or tenderness. Normal range of motion.     Cervical back: Normal range of motion and neck supple. No rigidity or tenderness.     Right lower leg: No edema.     Left lower leg: No edema.  Lymphadenopathy:     Cervical: No cervical adenopathy.  Skin:    General: Skin is warm and dry.     Capillary Refill: Capillary refill takes less than 2 seconds.     Coloration: Skin is not pale.     Findings: No erythema or rash.  Neurological:     General: No focal deficit present.     Mental Status: She is alert and oriented to person, place, and time.     Cranial Nerves: Cranial nerves are intact. No cranial nerve deficit or facial asymmetry.     Motor: Motor function is intact. No weakness.     Coordination: Coordination is intact.     Gait: Gait is intact. Gait normal.  Psychiatric:        Mood and Affect: Mood  normal.        Behavior: Behavior normal.     ED Results / Procedures / Treatments   Labs (all labs ordered are listed, but only abnormal results are displayed) Labs Reviewed  BASIC METABOLIC PANEL - Abnormal; Notable for the following components:      Result Value   Glucose, Bld 102 (*)    All other components within normal limits  CBC - Abnormal; Notable for the following components:   Platelets 438 (*)    All other components within normal limits  I-STAT BETA HCG BLOOD, ED ([facility], [facility], [facility] ONLY)    EKG EKG Interpretation  Date/Time:  Sunday June 16 2020 10:00:43 EST Ventricular Rate:  105 PR Interval:  174 QRS Duration: 88 QT Interval:  340 QTC Calculation: 449 R Axis:   39 Text Interpretation: Sinus tachycardia Nonspecific T wave abnormality Abnormal ECG Confirmed by Lennice Sites [phone]) on 06/16/2020 11:21:03 AM   Radiology DG Chest Port 1 View  Result Date: 06/16/2020 CLINICAL DATA:  Shortness of breath EXAM: PORTABLE CHEST 1 VIEW COMPARISON:  None. FINDINGS: The cardiomediastinal silhouette is normal in contour. No pleural effusion. No pneumothorax. No acute pleuroparenchymal abnormality. Visualized abdomen is unremarkable. No acute osseous abnormality noted. IMPRESSION: No acute cardiopulmonary abnormality. Electronically Signed   By: Valentino Saxon MD   On: 06/16/2020 12:02    Procedures Procedures (including critical care time)  Medications Ordered in ED Medications  sodium chloride flush (NS) 0.9 % injection 3 mL (has no administration in time range)  acetaminophen (TYLENOL) tablet 1,000 mg (1,000 mg Oral Given 06/16/20 1301)  albuterol (VENTOLIN HFA) 108 (90 Base) MCG/ACT inhaler 4 puff (4 puffs Inhalation  Given 06/16/20 1301)  ipratropium (ATROVENT HFA) inhaler 2 puff (2 puffs Inhalation Given 06/16/20 1301)    ED Course  I have reviewed the triage vital signs and the nursing notes.  Pertinent labs & imaging results that were available during my  care of the patient were reviewed by me and considered in my medical decision making (see chart for details).    MDM Rules/Calculators/A&P                          Dung Prien is a 24 y.o. female with pertinent past medical history of anxiety, asthma, that presents emergency department today for shortness of breath, cough headache and generalized weakness after finding out that she was Covid positive yesterday. Patient appears well with no respiratory distress, however mild expiratory wheezes on exam, will give albuterol and Atrovent and reevaluate. Chest x-ray interpreted by me without any acute cardiopulmonary disease. EKG interpreted by Dr. Ronnald Nian. Work-up today unremarkable with normal CBC and CMP.   Patient wants to receive  MAB infusion, will qualify based on BMI. Will message them.  Patient states that she got Covid test results at [facility], however her phone died therefore unable to put the picture in her chart.   Patient was able to ambulate without any problems, remained at 100% while ambulating.  Patient states that shortness of breath has severely resolved with albuterol and Atrovent.  States that headaches and myalgias feel better after Tylenol given, patient ready to be discharged at this time, will follow up with her PCP.  Symptomatic treatment discussed, patient education on how to use inhalers.  Patient expressed understanding.  Strict return precautions given.  Did express to patient that the [facility] infusion clinic will call her within 48 hours.  Doubt need for further emergent work up at this time. I explained the diagnosis and have given explicit precautions to return to the ER including for any other new or worsening symptoms. The patient understands and accepts the medical plan as it's been dictated and I have answered their questions. Discharge instructions concerning home care and prescriptions have been given. The patient is STABLE and is discharged to home in good  condition.   Final Clinical Impression(s) / ED Diagnoses Final diagnoses:  COVID    Rx / DC Orders ED Discharge Orders    None       Alfredia Client, PA-C 06/16/20 [place], [place], DO 06/16/20 1453

## 2020-06-16 NOTE — ED Triage Notes (Signed)
Pt states she just received + COVID test results.  C/o SOB, headache, and generalized weakness.  Denies fever.

## 2020-06-16 NOTE — ED Notes (Signed)
Pt verbalized understanding of discharge paperwork and follow-up care.  °

## 2020-06-17 ENCOUNTER — Other Ambulatory Visit: Payer: Self-pay | Admitting: Physician Assistant

## 2020-06-17 ENCOUNTER — Telehealth: Payer: Self-pay | Admitting: Physician Assistant

## 2020-06-17 DIAGNOSIS — O99212 Obesity complicating pregnancy, second trimester: Secondary | ICD-10-CM

## 2020-06-17 DIAGNOSIS — C8580 Other specified types of non-Hodgkin lymphoma, unspecified site: Secondary | ICD-10-CM

## 2020-06-17 DIAGNOSIS — J45909 Unspecified asthma, uncomplicated: Secondary | ICD-10-CM

## 2020-06-17 DIAGNOSIS — U071 COVID-19: Secondary | ICD-10-CM

## 2020-06-17 NOTE — Progress Notes (Signed)
I connected by phone with Christina Gilbert on 06/17/2020 at 10:09 AM to discuss the potential use of a new treatment for mild to moderate COVID-19 viral infection in non-hospitalized patients.  This patient is a 24 y.o. female that meets the FDA criteria for Emergency Use Authorization of COVID monoclonal antibody casirivimab/imdevimab, bamlanivimab/eteseviamb, or sotrovimab.  Has a (+) direct SARS-CoV-2 viral test result  Has mild or moderate COVID-19   Is NOT hospitalized due to COVID-19  Is within 10 days of symptom onset  Has at least one of the high risk factor(s) for progression to severe COVID-19 and/or hospitalization as defined in EUA.  Specific high risk criteria : BMI > 25, Chronic Lung Disease and Other high risk medical condition per CDC:  hx of cancer, high SVI   I have spoken and communicated the following to the patient or parent/caregiver regarding COVID monoclonal antibody treatment:  1. FDA has authorized the emergency use for the treatment of mild to moderate COVID-19 in adults and pediatric patients with positive results of direct SARS-CoV-2 viral testing who are 51 years of age and older weighing at least 40 kg, and who are at high risk for progressing to severe COVID-19 and/or hospitalization.  2. The significant known and potential risks and benefits of COVID monoclonal antibody, and the extent to which such potential risks and benefits are unknown.  3. Information on available alternative treatments and the risks and benefits of those alternatives, including clinical trials.  4. Patients treated with COVID monoclonal antibody should continue to self-isolate and use infection control measures (e.g., wear mask, isolate, social distance, avoid sharing personal items, clean and disinfect "high touch" surfaces, and frequent handwashing) according to CDC guidelines.   5. The patient or parent/caregiver has the option to accept or refuse COVID monoclonal antibody  treatment.  After reviewing this information with the patient, the patient has agreed to receive one of the available covid 19 monoclonal antibodies and will be provided an appropriate fact sheet prior to infusion. Tami Lin Robet Cr[place], [place] 06/17/2020 10:09 AM

## 2020-06-17 NOTE — Telephone Encounter (Signed)
Called to discuss with patient about Covid symptoms and the use of sotrovimab, bamlanivimab/etesevimab or casirivimab/imdevimab, a monoclonal antibody infusion for those with mild to moderate Covid symptoms and at a high risk of hospitalization.  Pt is qualified for this infusion at the [facility] due to; Specific high risk criteria : BMI > 25, high SVI   Message left to call back our hotline [phone]. My chart message sent if active on Mychart.   Angelena Form PA-C

## 2020-06-19 ENCOUNTER — Ambulatory Visit (HOSPITAL_COMMUNITY): Payer: Medicaid Other | Attending: Pulmonary Disease

## 2020-07-27 ENCOUNTER — Emergency Department (HOSPITAL_COMMUNITY)
Admission: EM | Admit: 2020-07-27 | Discharge: 2020-07-27 | Disposition: A | Discharge status: Home / Self Care | Payer: Medicaid Other | Attending: Emergency Medicine | Admitting: Emergency Medicine

## 2020-07-27 ENCOUNTER — Other Ambulatory Visit: Payer: Self-pay

## 2020-07-27 ENCOUNTER — Encounter (HOSPITAL_COMMUNITY): Payer: Self-pay

## 2020-07-27 ENCOUNTER — Emergency Department (HOSPITAL_COMMUNITY): Payer: Medicaid Other

## 2020-07-27 DIAGNOSIS — J45909 Unspecified asthma, uncomplicated: Secondary | ICD-10-CM | POA: Diagnosis not present

## 2020-07-27 DIAGNOSIS — W268XXA Contact with other sharp object(s), not elsewhere classified, initial encounter: Secondary | ICD-10-CM | POA: Diagnosis not present

## 2020-07-27 DIAGNOSIS — S61411A Laceration without foreign body of right hand, initial encounter: Secondary | ICD-10-CM | POA: Insufficient documentation

## 2020-07-27 DIAGNOSIS — Z23 Encounter for immunization: Secondary | ICD-10-CM | POA: Diagnosis not present

## 2020-07-27 DIAGNOSIS — S6991XA Unspecified injury of right wrist, hand and finger(s), initial encounter: Secondary | ICD-10-CM | POA: Diagnosis present

## 2020-07-27 MED ORDER — LIDOCAINE HCL (PF) 1 % IJ SOLN
5.0000 mL | Freq: Once | INTRAMUSCULAR | Status: AC
  Administered 2020-07-27: 5 mL via INTRADERMAL
  Filled 2020-07-27: qty 5

## 2020-07-27 MED ORDER — TETANUS-DIPHTH-ACELL PERTUSSIS 5-2.5-18.5 LF-MCG/0.5 IM SUSY
0.5000 mL | PREFILLED_SYRINGE | Freq: Once | INTRAMUSCULAR | Status: AC
  Administered 2020-07-27: 0.5 mL via INTRAMUSCULAR
  Filled 2020-07-27: qty 0.5

## 2020-07-27 NOTE — ED Notes (Signed)
X-ray at bedside

## 2020-07-27 NOTE — ED Provider Notes (Signed)
[facility] Provider Note   CSN: [id number] Arrival date & time: 07/27/20  1408     History Chief Complaint  Patient presents with  . Extremity Laceration    [facility] is a 24 y.o. female.  24 y.o female with no PMH presents to the ED with a chief complaint of right hand laceration.  Patient reports trying to open her last ladder when she suddenly cut her right palm.  Bleeding was controlled with time, she did not benefit with a towel along with a pantyhose.  She reports pain with movement of her hand especially closing the hand.  Not taking any medication for improvement in her symptoms.  Her last tetanus immunization is unknown.  No other complaints or injuries.  The history is provided by the patient.       Past Medical History:  Diagnosis Date  . Anxiety    with pregnancy   . Asthma    last used inhaler at age 74  . Constipation    chronic constipation  . Depression    with pregnancy   . GERD (gastroesophageal reflux disease)    Past hx with pregnancies   . HSV infection   . Non Hodgkin's lymphoma ([place])    at age 74  . NVD (normal vaginal delivery)    x 3  . Pregnancy induced hypertension     Patient Active Problem List   Diagnosis Date Noted  . Constipation, chronic 08/14/2019  . Gastritis 08/14/2019  . 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 Non Hodgkin's lymphoma ([place])   . 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 Morbid obesity ([place]) 03/30/2017  . Indication for care in labor or delivery 02/15/2017  . Trichomonas contact 11/06/2016  . Chronic hypertension affecting pregnancy 09/13/2015  . Susceptible to varicella (non-immune), currently pregnant 03/25/2015  . Asthma, well controlled 06/15/2013  . Obesity affecting pregnancy in second trimester 06/12/2013  . 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 LAPAROSCOPIC CHOLECYSTECTOMY SINGLE SITE WITH  INTRAOPERATIVE CHOLANGIOGRAM N/A 08/23/2019   Procedure: LAPAROSCOPIC CHOLECYSTECTOMY SINGLE SITE, lysis of adhesions;  Surgeon: Michael Boston, MD;  Location: [facility];  Service: General;  Laterality: N/A;  . LYMPH GLAND EXCISION    . NSVD     x 3   . TONSILLECTOMY    . WISDOM TOOTH EXTRACTION       OB History    Gravida  3   Para  3   Term  3   Preterm      AB      Living  3     SAB      IAB      Ectopic      Multiple  0   Live Births  3           Family History  Problem Relation Age of Onset  . Diabetes Mother   . Hypertension Mother   . Diabetes Father   . Hypertension Father   . Cholelithiasis Maternal Grandfather   . Colon cancer Neg Hx   . Colon polyps Neg Hx   . Esophageal cancer Neg Hx   . Rectal cancer Neg Hx   . Stomach cancer Neg Hx     Social History   Tobacco Use  . Smoking status: Never Smoker  . Smokeless tobacco: Never Used  Vaping Use  . Vaping Use: Never used  Substance Use  Topics  . Alcohol use: Not Currently  . Drug use: Never    Home Medications Prior to Admission medications   Medication Sig Start Date End Date Taking? Authorizing Provider  ondansetron (ZOFRAN) 4 MG tablet Take 1 tablet (4 mg total) by mouth every 8 (eight) hours as needed for nausea. 08/23/19   Michael Boston, MD  traMADol (ULTRAM) 50 MG tablet Take 1-2 tablets (50-100 mg total) by mouth every 6 (six) hours as needed for moderate pain or severe pain. 08/23/19   Michael Boston, MD    Allergies    Patient has no known allergies.  Review of Systems   Review of Systems  Constitutional: Negative for fever.  Skin: Positive for wound.    Physical Exam Updated Vital Signs BP (!) 152/77 (BP Location: Right Arm)   Pulse (!) 104   Temp 98.7 F (37.1 C) (Oral)   Resp 16   SpO2 100%   Physical Exam Vitals and nursing note reviewed.  Constitutional:      Appearance: Normal appearance.  HENT:     Head: Normocephalic and atraumatic.   Pulmonary:     Effort: Pulmonary effort is normal.  Abdominal:     General: Abdomen is flat.  Musculoskeletal:     Right hand: Laceration present.     Cervical back: Normal range of motion and neck supple.     Comments: Laceration to the palm of the right hand x 3.  Pulses present, capillary refills intact, sensation is equal throughout.  Skin:    General: Skin is warm and dry.     Findings: Erythema present.  Neurological:     Mental Status: She is alert and oriented to person, place, and time.     ED Results / Procedures / Treatments   Labs (all labs ordered are listed, but only abnormal results are displayed) Labs Reviewed - No data to display  EKG None  Radiology DG Hand Complete Right  Result Date: 07/27/2020 CLINICAL DATA:  Laceration EXAM: RIGHT HAND - COMPLETE 3+ VIEW COMPARISON:  None. FINDINGS: No evidence of fracture of the carpal or metacarpal bones. Radiocarpal joint is intact. Phalanges are normal. Small laceration ulnar to the fifth metacarpal. No foreign body. IMPRESSION: No fracture or foreign body. Electronically Signed   By: Suzy Bouchard M.D.   On: 07/27/2020 14:52    Procedures .Marland KitchenLaceration Repair  Date/Time: 07/27/2020 2:58 PM Performed by: Janeece Fitting, PA-C Authorized by: Janeece Fitting, PA-C   Consent:    Consent obtained:  Verbal   Consent given by:  Patient   Risks, benefits, and alternatives were discussed: yes     Risks discussed:  Infection and pain Universal protocol:    Patient identity confirmed:  Verbally with patient Laceration details:    Location:  Hand   Hand location:  R palm   Length (cm):  2 Exploration:    Hemostasis achieved with:  Direct pressure Treatment:    Area cleansed with:  Saline   Amount of cleaning:  Extensive   Irrigation solution:  Sterile saline Skin repair:    Repair method:  Sutures   Suture size:  4-0   Suture material:  Prolene   Suture technique:  Simple interrupted   Number of sutures:   4 Approximation:    Approximation:  Close Repair type:    Repair type:  Simple Post-procedure details:    Dressing:  Open (no dressing)   Procedure completion:  Tolerated well, no immediate complications   (including critical care  time)  Medications Ordered in ED Medications  Tdap (BOOSTRIX) injection 0.5 mL (has no administration in time range)  lidocaine (PF) (XYLOCAINE) 1 % injection 5 mL (5 mLs Intradermal Given by Other 07/27/20 1442)    ED Course  I have reviewed the triage vital signs and the nursing notes.  Pertinent labs & imaging results that were available during my care of the patient were reviewed by me and considered in my medical decision making (see chart for details).    MDM Rules/Calculators/A&P    Patient here with right hand laceration, last tetanus vaccine is unknown.  She is neurovascularly intact, pain with hand flexion.  Will obtain x-ray to rule out any foreign bodies.  Will repair duration via sutures.  Wound was extensively irrigated, I have repaired with 4 sutures to the right hand.Patient is stable for discharge.     Portions of this note were generated with [employer]. Dictation errors may occur despite best attempts at proofreading.  Final Clinical Impression(s) / ED Diagnoses Final diagnoses:  Laceration of right hand without foreign body, initial encounter    Rx / DC Orders ED Discharge Orders    None       Janeece Fitting, PA-C 07/27/20 1500    Quintella Reichert, MD 07/27/20 1537

## 2020-07-27 NOTE — ED Notes (Signed)
Discharge instructions provided to patient. Verbalized understanding. Alert and oriented. Ambulated with steady gait out of ED. 

## 2020-07-27 NOTE — ED Triage Notes (Signed)
Pt cut her right hand with a bottle while trying to open it. Bleeding controlled.

## 2020-07-27 NOTE — Discharge Instructions (Signed)
I have placed 4 sutures to your right hand, please have these removed within the next 7 to 10 days.  If you experience any redness, fever, drainage please return to the emergency department.

## 2020-08-13 ENCOUNTER — Other Ambulatory Visit: Payer: Self-pay

## 2020-08-13 ENCOUNTER — Encounter (HOSPITAL_COMMUNITY): Payer: Self-pay

## 2020-08-13 ENCOUNTER — Emergency Department (HOSPITAL_COMMUNITY)
Admission: EM | Admit: 2020-08-13 | Discharge: 2020-08-13 | Disposition: A | Discharge status: Home / Self Care | Payer: Medicaid Other | Attending: Emergency Medicine | Admitting: Emergency Medicine

## 2020-08-13 ENCOUNTER — Ambulatory Visit (HOSPITAL_COMMUNITY)
Admission: EM | Admit: 2020-08-13 | Discharge: 2020-08-13 | Disposition: A | Discharge status: Home / Self Care | Payer: Medicaid Other | Attending: Family Medicine | Admitting: Family Medicine

## 2020-08-13 DIAGNOSIS — Z4802 Encounter for removal of sutures: Secondary | ICD-10-CM | POA: Diagnosis not present

## 2020-08-13 DIAGNOSIS — X58XXXD Exposure to other specified factors, subsequent encounter: Secondary | ICD-10-CM | POA: Insufficient documentation

## 2020-08-13 DIAGNOSIS — S61411D Laceration without foreign body of right hand, subsequent encounter: Secondary | ICD-10-CM | POA: Diagnosis not present

## 2020-08-13 DIAGNOSIS — Z5321 Procedure and treatment not carried out due to patient leaving prior to being seen by health care provider: Secondary | ICD-10-CM | POA: Diagnosis not present

## 2020-08-13 NOTE — ED Triage Notes (Signed)
Pt presents with c/o suture removal. Pt has some sutures in her right hand, here to have them removed, no complications.

## 2020-08-13 NOTE — ED Triage Notes (Signed)
Pt here for suture removal of wound closure in which 4 sutures were placed to right hand on 12/25. Pt denies drainage, increased redness, fever, or increased pain to area.  Sutures removed, and area cleansed and bacitracin applied with DSG.

## 2020-08-16 ENCOUNTER — Other Ambulatory Visit: Payer: Medicaid Other

## 2020-08-16 DIAGNOSIS — Z20822 Contact with and (suspected) exposure to covid-19: Secondary | ICD-10-CM

## 2020-08-18 LAB — NOVEL CORONAVIRUS, NAA: SARS-CoV-2, NAA: DETECTED — AB

## 2020-08-18 LAB — SARS-COV-2, NAA 2 DAY TAT

## 2020-09-16 ENCOUNTER — Ambulatory Visit (INDEPENDENT_AMBULATORY_CARE_PROVIDER_SITE_OTHER): Payer: Medicaid Other | Admitting: Obstetrics and Gynecology

## 2020-09-16 ENCOUNTER — Encounter: Payer: Self-pay | Admitting: Obstetrics and Gynecology

## 2020-09-16 ENCOUNTER — Other Ambulatory Visit: Payer: Self-pay

## 2020-09-16 ENCOUNTER — Other Ambulatory Visit (HOSPITAL_COMMUNITY)
Admission: RE | Admit: 2020-09-16 | Discharge: 2020-09-16 | Disposition: A | Discharge status: Home / Self Care | Payer: Medicaid Other | Source: Ambulatory Visit | Attending: Obstetrics and Gynecology | Admitting: Obstetrics and Gynecology

## 2020-09-16 DIAGNOSIS — Z01419 Encounter for gynecological examination (general) (routine) without abnormal findings: Secondary | ICD-10-CM

## 2020-09-16 DIAGNOSIS — Z6838 Body mass index (BMI) 38.0-38.9, adult: Secondary | ICD-10-CM

## 2020-09-16 DIAGNOSIS — Z30431 Encounter for routine checking of intrauterine contraceptive device: Secondary | ICD-10-CM

## 2020-09-16 NOTE — Progress Notes (Signed)
Patient would like to be screen for STI and yeast and BV. 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

## 2020-09-16 NOTE — Patient Instructions (Signed)
[email or web address]  Genital Herpes Genital herpes is a common sexually transmitted infection (STI) that is caused by a virus. The virus spreads from person to person through sexual contact. The infection can cause itching, blisters, and sores around the genitals or rectum. Symptoms may last for several days and then go away. This is called an outbreak. The virus remains in the body, however, so more outbreaks may happen in the future. The time between outbreaks varies and can be from months to years. Genital herpes can affect anyone. It is particularly concerning for pregnant women because the virus can be passed to the baby during delivery and can cause serious problems. Genital herpes is also a concern for people who have a weak disease-fighting system (immune system). What are the causes? This condition is caused by the human herpesvirus, also called herpes simplex virus, type 1 or type 2 (HSV-1 or HSV-2). The virus may spread through: Sexual contact with an infected person, including vaginal, anal, and oral sex. Contact with fluid from a herpes sore. The skin. This means that you can get herpes from an infected partner even if there are no blisters or sores present. Your partner may not know that he or she is infected. What increases the risk? You are more likely to develop this condition if: You have sex with many partners. You do not use latex condoms during sex. What are the signs or symptoms? Most people do not have symptoms (are asymptomatic), or they have mild symptoms that may be mistaken for other skin problems. Symptoms may include: Small, red bumps near the genitals, rectum, or mouth. These bumps turn into blisters and then sores. Flu-like symptoms, including: Fever. Body aches. Swollen lymph nodes. Headache. Painful urination. Pain and itching in the genital area or rectal area. Vaginal  discharge. Tingling or shooting pain in the legs and buttocks. Generally, symptoms are more severe and last longer during the first (primary) outbreak. Flu-like symptoms are also more common during the primary outbreak.   How is this diagnosed? This condition may be diagnosed based on: A physical exam. Your medical history. Blood tests. A test of a fluid sample (culture) from an open sore. How is this treated? There is no cure for this condition, but treatment with antiviral medicines that are taken by mouth (orally) can do the following: Speed up healing and relieve symptoms. Help to reduce the spread of the virus to sexual partners. Limit the chance of future outbreaks, or make future outbreaks shorter. Lessen symptoms of future outbreaks. Your health care provider may also recommend pain relief medicines, such as aspirin or ibuprofen. Follow these instructions at home: If you have an outbreak: Keep the affected areas dry and clean. Avoid rubbing or touching blisters and sores. If you do touch blisters or sores: Wash your hands thoroughly with soap and water. Do not touch your eyes afterward. To help relieve pain or itching, you may take the following actions as told by your health care provider: Apply a cold, wet cloth (cold compress) to affected areas 4-6 times a day. Apply a substance that protects your skin and reduces bleeding (astringent). Apply a gel that helps relieve pain around sores (lidocaine gel). Take a warm, shallow bath that cleans the genital area (sitz bath). Sexual activity Do not have sexual contact during active outbreaks. Practice safe sex. Herpes can spread even if your partner does not have blisters or sores. Latex condoms and female condoms may help prevent the spread of the herpes virus. General  instructions Take over-the-counter and prescription medicines only as told by your health care provider. Keep all follow-up visits as told by your health care  provider. This is important. How is this prevented? Use condoms. Although you can get genital herpes during sexual contact even with the use of a condom, a condom can provide some protection. Avoid having multiple sexual partners. Talk with your sexual partner about any symptoms either of you may have. Also, talk with your partner about any history of STIs. Get tested for STIs before you have sex. Ask your partner to do the same. Do not have sexual contact if you have active symptoms of genital herpes. Contact a health care provider if: Your symptoms are not improving with medicine. Your symptoms return, or you have new symptoms. You have a fever. You have abdominal pain. You have redness, swelling, or pain in your eye. You notice new sores on other parts of your body. You are a woman and you experience bleeding between menstrual periods. You have had herpes and you become pregnant or plan to become pregnant. Summary Genital herpes is a common sexually transmitted infection (STI) that is caused by the herpes simplex virus, type 1 or type 2 (HSV-1 or HSV-2). These viruses are most often spread through sexual contact with an infected person. You are more likely to develop this condition if you have sex with many partners or you do not use condoms during sex. Most people do not have symptoms (are asymptomatic) or have mild symptoms that may be mistaken for other skin problems. Symptoms occur as outbreaks that may happen months or years apart. There is no cure for this condition, but treatment with oral antiviral medicines can reduce symptoms, reduce the chance of spreading the virus to a partner, prevent future outbreaks, or shorten future outbreaks. This information is not intended to replace advice given to you by your health care provider. Make sure you discuss any questions you have with your health care provider. Document Revised: 03/30/2019 Document Reviewed: 03/30/2019 Elsevier Patient  Education  2021 [place]. HPV and Cancer Information HPV (human papillomavirus)is a very common virus that spreads easily from person to person through skin-to-skin or sexual contact. There are many types of HPV. It often does not cause symptoms. However, depending upon the type, it may sometimes cause warts in the genitals (genital or mucosal HPV), or on the hands or feet (cutaneous or nonmucosal HPV). It is possible to be infected for a long time and pass HPV to others without knowing it. Some HPV infections go away on their own within 2 years, but other HPV infections are considered high-risk and may cause changes in cells that could lead to cancer. You can take steps to avoid HPV infection and to lower your risk of getting cancer. How can HPV affect me? HPV can cause warts in the genitals or on the hands or feet. It can also cause wart-like lesions in the throat.  Certain types of genital HPV can also cause cancer, which may include: Cervical cancer. Vaginal cancer. Vulvar cancer. Anal cancer. Throat cancer. Tongue or mouth cancer. Penile cancer. How does HPV spread? HPV spreads easily through direct person to person contact. Genital HPV spreads through sexual contact. You can get HPV from vaginal sex, oral sex, anal sex, or just by touching someone's genitals. Even people who have only one sexual partner may have HPV because that partner may have it. HPV often does not cause symptoms, so most infected people do not  know that they have it. What actions can I take to prevent HPV? Take the following steps to help prevent HPV infection: Talk with your health care provider about getting the HPV vaccine. This vaccine protects against the types of HPV that could cause cancer. Limit the number of people you have sex with. Also, avoid having sex with people who have had many sexual partners. Use a condom during sex. Talk with your sexual partners about their health.   What actions can I take  to lower my risk for cancer? Having a healthy lifestyle and taking some preventive steps can help lower your cancer risk, whether or not you have genital HPV. Some steps you can take include: Lifestyle Practice safe sex to help prevent HPV infection. Do not use any products that contain nicotine or tobacco, such as cigarettes, e-cigarettes, and chewing tobacco. If you need help quitting, ask your health care provider. Eat foods that have antioxidants, such as fruits, vegetables, and grains. Try to eat at least 5 servings of fruits and vegetables every day. Get regular exercise. Lose weight if you are overweight. Practice good oral hygiene. This includes flossing and brushing your teeth every day. Other preventive steps Get the HPV vaccine as told by your health care provider. Get tested for STIs even if you do not have symptoms of HPV. You may have HPV and not know it. If you are a woman, get regular Pap and HPV tests. Talk with your health care provider about how often you need these tests. Pap tests will help identify changes in cells that can lead to cancer. HPV tests will help identify the presence of HPV in cells in the cervix. Where to find more information Learn more about HPV and cancer from: Centers for Disease Control and Prevention: http://sweeney-todd.com/ [place]: www.cancer.gov American Cancer Society: www.cancer.org Contact a health care provider if: You have genital warts. You are sexually active and think you may have HPV. You did not protect yourself during sex and would like to be tested for STIs. Summary Human papillomavirus (HPV) is a very common virus that spreads easily from person to person and ishighly contagious. Certain types of genital HPV are considered to be high risk and may cause changes in cells that could lead to cancer. You should take steps to avoid HPV infection, such as limiting the number of people you have sex with, using condoms during sex,  and getting the HPV vaccine. Lifestyle changes can help lower your risk of cancer. These include eating a healthy diet, getting regular exercise, and not using any products that contain nicotine or tobacco. You may have HPV and not know it. Get tested for STIs even if you do not have symptoms of HPV. If you are a woman, have regular Pap tests and HPV tests as directed by your health care provider. This information is not intended to replace advice given to you by your health care provider. Make sure you discuss any questions you have with your health care provider. Document Revised: 03/05/2020 Document Reviewed: 03/05/2020 Elsevier Patient Education  2021 [place]. Preventing Cervical Cancer Cervical cancer is cancer that grows on the cervix. The cervix is at the bottom of the uterus. It connects the uterus to the vagina. The uterus is where a baby develops during pregnancy. Cancer occurs when cells become abnormal and start to grow out of control. If cervical cancer is not found early, it can spread and become dangerous. Cervical cancer cannot always be prevented,  but you can take steps to lower your risk of developing this condition. How can this condition affect me? Cervical cancer grows slowly and may not cause any symptoms at first. Over time, the cancer can grow deep into the cervix tissue and spread to other areas. This may take years, and it may happen without you knowing about it. If it is found early, cervical cancer can be treated effectively. If the cancer has grown deep into your cervix or has spread, it will be more difficult to treat. Most cases of cervical cancer are caused by an STI (sexually transmitted infection) called human papillomavirus (HPV). One way to reduce your risk of cervical cancer is to take steps to avoid infection with the HPV virus. Getting regular Pap tests is also important because this can help identify changes in cells that could lead to cancer. Your chances of  getting this disease can also be reduced by making certain lifestyle changes. What can increase my risk? You are more likely to develop this condition if:  You have certain things in your sexual history, such as: ? Having a sexually transmitted viral infection. These include chlamydia and herpes. ? Having more than one sexual partner, or having sex with someone who has more than one sexual partner. ? Not using condoms during sex. ? Having been sexually active before the age of 82.  Your mother took a medicine called diethylstilbestrol (DES) while pregnant with you, causing you to be exposed to this medicine before birth.  Your mother or sister has had cervical cancer.  You are between the ages of 63-50.  You have or have had certain other medical conditions, such as: ? Previous cancer of the vagina or vulva. ? A weakened body defense system (immune system). ? A history of dysplasia of the cervix.  You use oral contraceptives, also called birth control pills.  You smoke or breathe in secondhand smoke. What actions can I take to prevent cervical cancer? Preventing HPV infection  Ask your health care provider about getting the HPV vaccine. If you are 45 years old or younger, you may need to get this vaccine, which is given in three doses over 6 months. This vaccine protects against the types of HPV that could cause cancer.  Limit the number of people you have sex with. Also avoid having sex with people who have had many sex partners.  Use a latex condom every time you have sex.   Getting Pap tests Get Pap tests regularly, starting at age 41. Talk with your health care provider about how often you need these tests. Having regular Pap tests will help identify changes in cells that could lead to cancer. Steps can then be taken to prevent cancer from developing.  Most women who are 18?26 years of age should have a Pap test every 3 years.  Most women who are 106?26 years of age should  have a Pap test in combination with an HPV test every 5 years.  Women with a higher risk of cervical cancer, such as those with a weakened immune system or those who were exposed to DES medicine before birth, may need more frequent testing. Making other lifestyle changes  Do not use any products that contain nicotine or tobacco, such as cigarettes, e-cigarettes, and chewing tobacco. If you need help quitting, ask your health care provider.  Eat a healthy diet that includes at least 5 servings of fruits and vegetables every day.  Lose weight if you are overweight.  Where to find support Talk with your health care provider, school nurse, or local health department for guidance about screening and vaccination. Some children and teens may be able to get the HPV vaccine free of charge through the U.S. government's Vaccines for [facility]) program. Other places that provide vaccinations include:  Public health clinics. Check with your local health department.  [place], where you would pay only what you can afford. To find one near you, check this website: http://lyons.com/  [place]. These are part of a program for Medicare and Medicaid patients who live in rural areas. The National Breast and Cervical Cancer Early Detection Program also provides breast and cervical cancer screenings and diagnostic services to low-income, uninsured, and underinsured women. Cervical cancer can be passed down through families. Talk with your health care provider or a genetic counselor to learn more about genetic testing for cancer. Where to find more information Learn more about cervical cancer from:  [employer]ne[employer]: www.acog.org  American Cancer Society: www.cancer.org  Centers for Disease Control and Prevention: http://www.wolf.info/ Contact a health care provider if you have:  Pelvic pain.  Unusual discharge or bleeding from your  vagina. Summary  Cervical cancer is cancer that grows on the cervix. The cervix is at the bottom of the uterus.  Ask your health care provider about getting the HPV vaccine.  Be sure to get regular Pap tests as recommended by your health care provider.  See your health care provider right away if you have any pelvic pain or unusual discharge or bleeding from your vagina. This information is not intended to replace advice given to you by your health care provider. Make sure you discuss any questions you have with your health care provider. Document Revised: 02/20/2019 Document Reviewed: 02/20/2019 Elsevier Patient Education  [place].

## 2020-09-16 NOTE — Progress Notes (Signed)
GYNECOLOGY ANNUAL PREVENTATIVE CARE ENCOUNTER NOTE  History:     Christina Gilbert is a 25 y.o. [id number] female here for a routine annual gynecologic exam.  Current complaints: none.   Denies abnormal vaginal bleeding, discharge, pelvic pain, problems with intercourse or other gynecologic concerns.    Gynecologic History No LMP recorded. (Menstrual status: IUD). Contraception: IUD Last Pap: 2/21. Results were: abnormal with ASCUS- H  Obstetric History OB History  Gravida Para Term Preterm AB Living  3 3 3     3   SAB IAB Ectopic Multiple Live Births        0 3    # Outcome Date GA Lbr Len/2nd Weight Sex Delivery Anes PTL Lv  3 Term 02/15/17 [redacted]w[redacted]d 11:20 / 00:07 6 lb 0.8 oz (2.745 kg) F Vag-Spont EPI  LIV  2 Term 2017 [redacted]w[redacted]d    Vag-Spont   LIV  1 Term 2015 [redacted]w[redacted]d    Vag-Spont   LIV    Past Medical History:  Diagnosis Date  . Anxiety    with pregnancy   . Asthma    last used inhaler at age 53  . Constipation    chronic constipation  . Depression    with pregnancy   . GERD (gastroesophageal reflux disease)    Past hx with pregnancies   . HSV infection   . Non Hodgkin's lymphoma ([place])    at age 66  . NVD (normal vaginal delivery)    x 3  . Pregnancy induced hypertension     Past Surgical History:  Procedure Laterality Date  . LAPAROSCOPIC CHOLECYSTECTOMY SINGLE SITE WITH INTRAOPERATIVE CHOLANGIOGRAM N/A 08/23/2019   Procedure: LAPAROSCOPIC CHOLECYSTECTOMY SINGLE SITE, lysis of adhesions;  Surgeon: Michael Boston, MD;  Location: [facility];  Service: General;  Laterality: N/A;  . LYMPH GLAND EXCISION    . NSVD     x 3   . TONSILLECTOMY    . WISDOM TOOTH EXTRACTION      Current Outpatient Medications on File Prior to Visit  Medication Sig Dispense Refill  . levonorgestrel (MIRENA) 20 MCG/24HR IUD 1 each by Intrauterine route once.    . ondansetron (ZOFRAN) 4 MG tablet Take 1 tablet (4 mg total) by mouth every 8 (eight) hours as needed for nausea.  (Patient not taking: Reported on 09/16/2020) 5 tablet 3  . traMADol (ULTRAM) 50 MG tablet Take 1-2 tablets (50-100 mg total) by mouth every 6 (six) hours as needed for moderate pain or severe pain. (Patient not taking: Reported on 09/16/2020) 20 tablet 0   No current facility-administered medications on file prior to visit.    No Known Allergies  Social History:  reports that she has never smoked. She has never used smokeless tobacco. She reports previous alcohol use. She reports that she does not use drugs.  Family History  Problem Relation Age of Onset  . Diabetes Mother   . Hypertension Mother   . Diabetes Father   . Hypertension Father   . Cholelithiasis Maternal Grandfather   . Colon cancer Neg Hx   . Colon polyps Neg Hx   . Esophageal cancer Neg Hx   . Rectal cancer Neg Hx   . Stomach cancer Neg Hx     The following portions of the patient's history were reviewed and updated as appropriate: allergies, current medications, past family history, past medical history, past social history, past surgical history and problem list.  Review of Systems Pertinent items noted in HPI and remainder of  comprehensive ROS otherwise negative.  Physical Exam:  BP 138/85   Pulse 83   Wt 240 lb (108.9 kg)   BMI 38.74 kg/m  CONSTITUTIONAL: Well-developed, well-nourished female in no acute distress.  HENT:  Normocephalic, atraumatic, External right and left ear normal. Oropharynx is clear and moist EYES: Conjunctivae and EOM are normal. NECK: Normal range of motion, supple, no masses.  Normal thyroid.  SKIN: Skin is warm and dry. No rash noted. Not diaphoretic. No erythema. No pallor. MUSCULOSKELETAL: Normal range of motion. No tenderness.  No cyanosis, clubbing, or edema.  2+ distal pulses. NEUROLOGIC: Alert and oriented to person, place, and time. Normal reflexes, muscle tone coordination.  PSYCHIATRIC: Normal mood and affect. Normal behavior. Normal judgment and thought  content. CARDIOVASCULAR: Normal heart rate noted, regular rhythm RESPIRATORY: Clear to auscultation bilaterally. Effort and breath sounds normal, no problems with respiration noted. BREASTS: Symmetric in size. No masses, tenderness, skin changes, nipple drainage, or lymphadenopathy bilaterally. Performed in the presence of a chaperone. ABDOMEN: Soft, no distention noted.  No tenderness, rebound or guarding.  PELVIC: Normal appearing external genitalia and urethral meatus; normal appearing vaginal mucosa and cervix.  No abnormal discharge noted.  Pap smear obtained.  Normal uterine size, no other palpable masses, no uterine or adnexal tenderness.  IUD strings easily seen.  Performed in the presence of a chaperone.   Assessment and Plan:    1. Morbid obesity ([place])   2. BMI 38.0-38.9,adult   3. Women's annual routine gynecological examination Pap taken today, discussed need for colposcopy that was missed with previous diagnosis.  If pap is abnormal today, need compliance with future colposcopy - Cytology - PAP( [facility]) - Cervicovaginal ancillary only( [facility])  4. IUD check up IUD strings easily seen  Will follow up results of pap smear and manage accordingly. Routine preventative health maintenance measures emphasized. Please refer to After Visit Summary for other counseling recommendations.      Lynnda Shields, MD, [place] [facility], [place]

## 2020-09-17 LAB — CERVICOVAGINAL ANCILLARY ONLY
Bacterial Vaginitis (gardnerella): POSITIVE — AB
Candida Glabrata: NEGATIVE
Candida Vaginitis: NEGATIVE
Chlamydia: NEGATIVE
Comment: NEGATIVE
Comment: NEGATIVE
Comment: NEGATIVE
Comment: NEGATIVE
Comment: NEGATIVE
Comment: NORMAL
Neisseria Gonorrhea: NEGATIVE
Trichomonas: NEGATIVE

## 2020-09-20 LAB — CYTOLOGY - PAP
Comment: NEGATIVE
Diagnosis: UNDETERMINED — AB
High risk HPV: POSITIVE — AB

## 2020-10-23 ENCOUNTER — Ambulatory Visit: Payer: Medicaid Other | Admitting: Obstetrics and Gynecology

## 2020-12-17 IMAGING — DX DG CHEST 1V PORT
1 series · 1 of 1 positions shown · non-contrast
Comparison: None.

CLINICAL DATA: Shortness of breath

EXAM:
PORTABLE CHEST 1 VIEW

[chest ap]
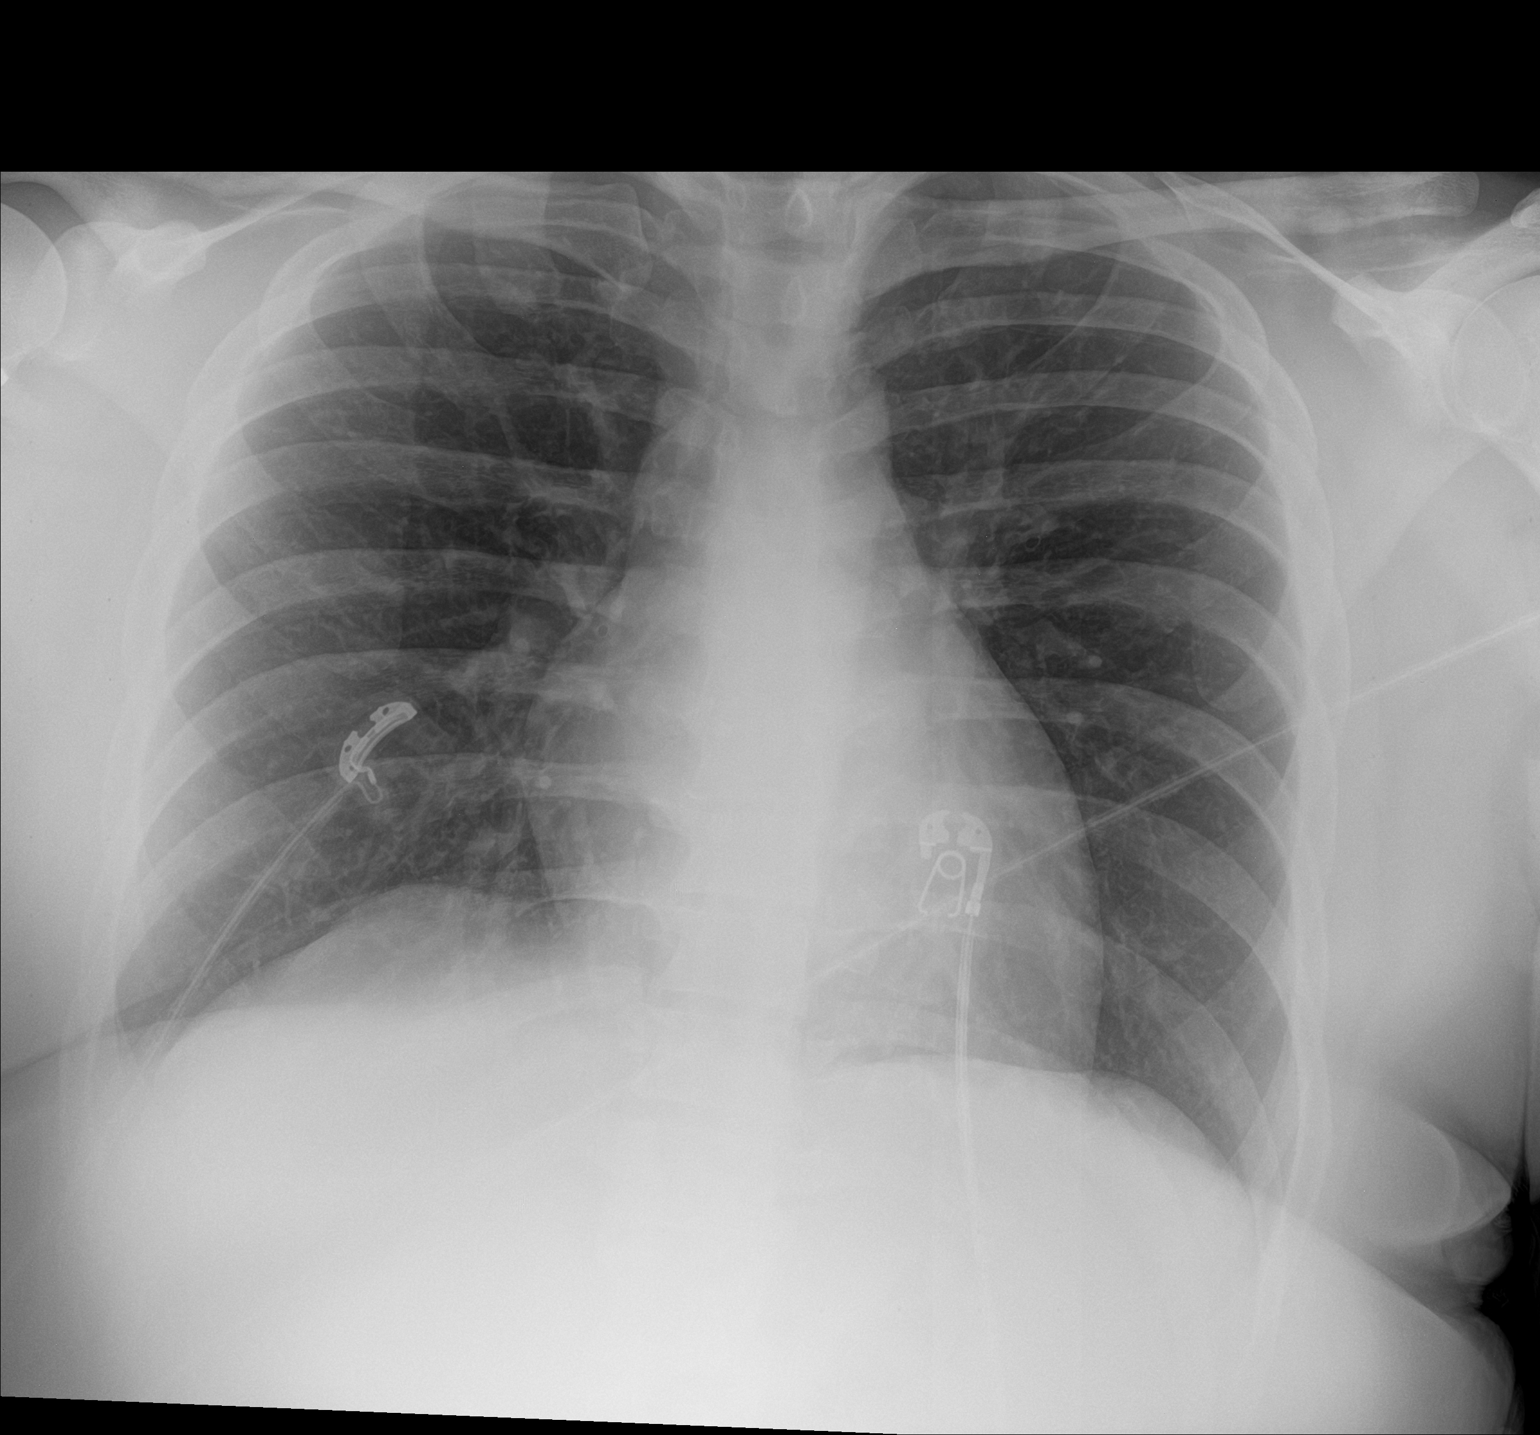

[1 of 1 positions shown; findings below may reference images not displayed]

FINDINGS: The cardiomediastinal silhouette is normal in contour. No pleural
effusion. No pneumothorax. No acute pleuroparenchymal abnormality.
Visualized abdomen is unremarkable. No acute osseous abnormality
noted.
IMPRESSION: No acute cardiopulmonary abnormality.

## 2024-02-21 ENCOUNTER — Ambulatory Visit
Admission: EM | Admit: 2024-02-21 | Discharge: 2024-02-21 | Disposition: A | Discharge status: Home / Self Care | Attending: Physician Assistant | Admitting: Physician Assistant

## 2024-02-21 ENCOUNTER — Encounter: Payer: Self-pay | Admitting: Emergency Medicine

## 2024-02-21 DIAGNOSIS — H6122 Impacted cerumen, left ear: Secondary | ICD-10-CM

## 2024-02-21 DIAGNOSIS — I10 Essential (primary) hypertension: Secondary | ICD-10-CM

## 2024-02-21 MED ORDER — OFLOXACIN 0.3 % OT SOLN: 5.0000 [drp] | Freq: Two times a day (BID) | OTIC | 0 refills | Status: AC

## 2024-02-21 MED ORDER — AMLODIPINE BESYLATE 5 MG PO TABS: 5.0000 mg | ORAL_TABLET | Freq: Every day | ORAL | 0 refills | Status: AC

## 2024-02-21 NOTE — ED Provider Notes (Signed)
 [facility]    CSN: [id number] Arrival date & time: 02/21/24  0809      History   Chief Complaint Chief Complaint  Patient presents with   Ear Fullness    HPI Christina Gilbert is a 28 y.o. female.   Patient presents today with a 5-day history of left ear fullness and decreased hearing.  She denies any significant pain.  Denies any recent illness including cough, congestion, sneezing, fever.  She denies history of recurrent ear infections.  She does report that she went swimming and wonders if this could have contributed to her symptoms.  She did try an over-the-counter drop for swimmer's ear but this was ineffective.  She does report having an episode of cerumen impaction many years ago that required flushing of her ears with a similar presentation but has not had issues with this recently.  She is confident that she is not pregnant.  She does occasionally use Q-tips but does not use earbuds or earplugs on a regular basis.  Denies any recent airplane travel.  Her blood pressure was elevated.  She does report someone telling her that she had elevated blood pressure readings in the past but has never taken antihypertensive medication.  She does have a strong family history of hypertension.  She denies any chest pain, shortness of breath, headache, vision change, dizziness.  She does not currently follow with her primary care provider.    Past Medical History:  Diagnosis Date   Anxiety    with pregnancy    Asthma    last used inhaler at age 59   Constipation    chronic constipation   Depression    with pregnancy    GERD (gastroesophageal reflux disease)    Past hx with pregnancies    HSV infection    Non Hodgkin's lymphoma (HCC)    at age 59   NVD (normal vaginal delivery)    x 3   Pregnancy induced hypertension     Patient Active Problem List   Diagnosis Date Noted   BMI 38.0-38.9,adult 09/16/2020   Women's annual routine gynecological examination 09/16/2020    IUD check up 09/16/2020   Constipation, chronic 08/14/2019   Gastritis 08/14/2019   Chronic calculous cholecystitis 08/14/2019   Non Hodgkin's lymphoma (HCC)    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   Morbid obesity (HCC) 03/30/2017   Indication for care in labor or delivery 02/15/2017   Trichomonas contact 11/06/2016   Chronic hypertension affecting pregnancy 09/13/2015   Susceptible to varicella (non-immune), currently pregnant 03/25/2015   Asthma, well controlled 06/15/2013   Obesity affecting pregnancy in second trimester 06/12/2013   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    Past Surgical History:  Procedure Laterality Date   LAPAROSCOPIC CHOLECYSTECTOMY SINGLE SITE WITH INTRAOPERATIVE CHOLANGIOGRAM N/A 08/23/2019   Procedure: LAPAROSCOPIC CHOLECYSTECTOMY SINGLE SITE, lysis of adhesions;  Surgeon: Sheldon Standing, MD;  Location: [facility];  Service: General;  Laterality: N/A;   LYMPH GLAND EXCISION     NSVD     x 3    TONSILLECTOMY     WISDOM TOOTH EXTRACTION      OB History     Gravida  3   Para  3   Term  3   Preterm      AB      Living  3      SAB      IAB      Ectopic  Multiple  0   Live Births  3            Home Medications    Prior to Admission medications   Medication Sig Start Date End Date Taking? Authorizing Provider  amLODipine  (NORVASC ) 5 MG tablet Take 1 tablet (5 mg total) by mouth daily. 02/21/24  Yes Nathalee Smarr K, PA-C  levonorgestrel (MIRENA) 20 MCG/24HR IUD 1 each by Intrauterine route once.   Yes [provider]  ofloxacin  (FLOXIN ) 0.3 % OTIC solution Place 5 drops into the left ear 2 (two) times daily. 02/21/24  Yes Del Overfelt, Rocky POUR, PA-C    Family History Family History  Problem Relation Age of Onset   Diabetes Mother    Hypertension Mother    Diabetes Father    Hypertension Father    Cholelithiasis Maternal Grandfather    Colon cancer Neg Hx    Colon polyps Neg Hx    Esophageal cancer  Neg Hx    Rectal cancer Neg Hx    Stomach cancer Neg Hx     Social History Social History   Tobacco Use   Smoking status: Never    Passive exposure: Current   Smokeless tobacco: Never  Vaping Use   Vaping status: Every Day   Substances: Nicotine  Substance Use Topics   Alcohol use: Not Currently   Drug use: Never     Allergies   Patient has no known allergies.   Review of Systems Review of Systems  Constitutional:  Positive for activity change. Negative for appetite change, fatigue and fever.  HENT:  Positive for hearing loss. Negative for congestion, ear discharge, ear pain, sinus pressure, sneezing and sore throat.   Eyes:  Negative for visual disturbance.  Respiratory:  Negative for shortness of breath.   Cardiovascular:  Negative for chest pain.  Neurological:  Negative for dizziness and headaches.     Physical Exam Triage Vital Signs ED Triage Vitals  Encounter Vitals Group     BP 02/21/24 0819 (!) 152/115     Girls Systolic BP Percentile --      Girls Diastolic BP Percentile --      Boys Systolic BP Percentile --      Boys Diastolic BP Percentile --      Pulse Rate 02/21/24 0819 92     Resp 02/21/24 0819 18     Temp 02/21/24 0819 97.9 F (36.6 C)     Temp Source 02/21/24 0819 Oral     SpO2 02/21/24 0819 99 %     Weight 02/21/24 0818 295 lb (133.8 kg)     Height --      Head Circumference --      Peak Flow --      Pain Score 02/21/24 0817 3     Pain Loc --      Pain Education --      Exclude from Growth Chart --    No data found.  Updated Vital Signs BP (!) 151/125 (BP Location: Left Wrist) Comment: 2nd reading; not maintained  Pulse 92   Temp 97.9 F (36.6 C) (Oral)   Resp 18   Wt 295 lb (133.8 kg)   LMP  (LMP Unknown)   SpO2 99%   Breastfeeding No   BMI 47.61 kg/m   Visual Acuity Right Eye Distance:   Left Eye Distance:   Bilateral Distance:    Right Eye Near:   Left Eye Near:    Bilateral Near:     Physical  Exam Vitals  reviewed.  Constitutional:      General: She is awake. She is not in acute distress.    Appearance: Normal appearance. She is well-developed. She is not ill-appearing.     Comments: Very pleasant female appears stated age in no acute distress sitting comfortably in exam room  HENT:     Head: Normocephalic and atraumatic.     Right Ear: Tympanic membrane, ear canal and external ear normal. Tympanic membrane is not erythematous or bulging.     Left Ear: Tympanic membrane, ear canal and external ear normal. There is impacted cerumen. Tympanic membrane is not erythematous or bulging.     Ears:     Comments: Left ear: Cerumen impaction noted.  Unable to visualize TM.  This resolved following in office irrigation revealing normal TM but macerated external auditory canal.    Mouth/Throat:     Pharynx: Uvula midline. No oropharyngeal exudate or posterior oropharyngeal erythema.  Cardiovascular:     Rate and Rhythm: Normal rate and regular rhythm.     Heart sounds: Normal heart sounds, S1 normal and S2 normal. No murmur heard. Pulmonary:     Effort: Pulmonary effort is normal.     Breath sounds: Normal breath sounds. No wheezing, rhonchi or rales.     Comments: Clear to auscultation bilaterally Psychiatric:        Behavior: Behavior is cooperative.      UC Treatments / Results  Labs (all labs ordered are listed, but only abnormal results are displayed) Labs Reviewed - No data to display  EKG   Radiology No results found.  Procedures Procedures (including critical care time)  Medications Ordered in UC Medications - No data to display  Initial Impression / Assessment and Plan / UC Course  I have reviewed the triage vital signs and the nursing notes.  Pertinent labs & imaging results that were available during my care of the patient were reviewed by me and considered in my medical decision making (see chart for details).     Patient is well-appearing, afebrile, nontoxic,  nontachycardic.  She was noted to have a cerumen impaction which resolved following in office irrigation.  She did have significant maceration of her ear canal likely related to the irrigation, however, she was given ofloxacin  drops to apply twice daily for a minimum of 5 days to ensure that there is no associated otitis externa.  Recommend that she avoid putting anything in the ear including earbuds, Q-tips, earplugs as well as submerging head in water for the next 5 days.  If she has any changing or worsening symptoms including otorrhea, fever, nausea, vomiting she needs to be seen immediately.  Her blood pressure was elevated in review of previous blood pressure readings indicated that this has been elevated up to several years ago but she is not currently receiving any medication for hypertension.  EKG was obtained given significant elevation of her diastolic reading that showed normal sinus rhythm without ischemic changes; compared to previous tracing of 06/17/2020 normal sinus rhythm has replaced sinus tachycardia and there are nonspecific ST changes in aVL.  I did recommend that she follow-up with her primary care as soon as possible and so we established her with someone in the area before leaving and appointment was scheduled.  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  If her blood pressure remains above 140/90 she  is to increase to a full tablet (5 mg daily).  She is to avoid decongestants, caffeine, sodium, NSAIDs.  We discussed that if she develops any chest pain, shortness of breath, headache, vision change, dizziness she needs to go to the ER.  Strict return precautions given.  All questions answered to patient's satisfaction.  Final Clinical Impressions(s) / UC Diagnoses   Final diagnoses:  Hearing loss due to cerumen impaction, left  Impacted cerumen of left ear  Elevated blood pressure  reading in office with diagnosis of hypertension     Discharge Instructions      We were able to remove the wax.  Your ear canal was kind of irritated likely because of the removal process.  Apply ofloxacin  drops twice a day for 5 days.  Do not submerge your head in water for the next 5 days and do not put anything in the ear including earbuds, Q-tips, earplugs.  If you have any pain, drainage, fever please return for reevaluation.  Your EKG looked great.  I do want a start you on a low-dose blood pressure medication.  Start by taking half a tablet every day for a week and monitor your blood pressure.  If this remains above 140/90 then increase to a full tablet after that.  Follow-up with primary care as scheduled.  Avoid decongestants, caffeine, sodium, NSAIDs (aspirin , ibuprofen /Advil , naproxen/Aleve).  If you develop any chest pain, shortness of breath, headache, vision change, dizziness you need to go to the ER.     ED Prescriptions     Medication Sig Dispense Auth. Provider   ofloxacin  (FLOXIN ) 0.3 % OTIC solution Place 5 drops into the left ear 2 (two) times daily. 5 mL Benno Brensinger K, PA-C   amLODipine  (NORVASC ) 5 MG tablet Take 1 tablet (5 mg total) by mouth daily. 30 tablet Chyanne Kohut K, PA-C      PDMP not reviewed this encounter.   Sherrell Rocky POUR, PA-C 02/21/24 1017

## 2024-02-21 NOTE — ED Triage Notes (Signed)
 Pt presents c/o ear fullness x 5 days in left ear.

## 2024-02-21 NOTE — Discharge Instructions (Addendum)
 We were able to remove the wax.  Your ear canal was kind of irritated likely because of the removal process.  Apply ofloxacin  drops twice a day for 5 days.  Do not submerge your head in water for the next 5 days and do not put anything in the ear including earbuds, Q-tips, earplugs.  If you have any pain, drainage, fever please return for reevaluation.  Your EKG looked great.  I do want a start you on a low-dose blood pressure medication.  Start by taking half a tablet every day for a week and monitor your blood pressure.  If this remains above 140/90 then increase to a full tablet after that.  Follow-up with primary care as scheduled.  Avoid decongestants, caffeine, sodium, NSAIDs (aspirin , ibuprofen /Advil , naproxen/Aleve).  If you develop any chest pain, shortness of breath, headache, vision change, dizziness you need to go to the ER.

## 2024-04-19 ENCOUNTER — Ambulatory Visit (INDEPENDENT_AMBULATORY_CARE_PROVIDER_SITE_OTHER): Admitting: Family

## 2024-04-19 VITALS — BP 136/83 | HR 98 | Temp 98.1°F | Resp 18 | Ht 66.0 in | Wt 288.0 lb

## 2024-04-19 DIAGNOSIS — J4520 Mild intermittent asthma, uncomplicated: Secondary | ICD-10-CM | POA: Diagnosis not present

## 2024-04-19 DIAGNOSIS — Z0130 Encounter for examination of blood pressure without abnormal findings: Secondary | ICD-10-CM | POA: Diagnosis not present

## 2024-04-19 DIAGNOSIS — H6122 Impacted cerumen, left ear: Secondary | ICD-10-CM

## 2024-04-19 DIAGNOSIS — Z7689 Persons encountering health services in other specified circumstances: Secondary | ICD-10-CM

## 2024-04-19 MED ORDER — ALBUTEROL SULFATE HFA 108 (90 BASE) MCG/ACT IN AERS: 2.0000 | INHALATION_SPRAY | Freq: Four times a day (QID) | RESPIRATORY_TRACT | 2 refills | Status: AC | PRN

## 2024-04-19 NOTE — Progress Notes (Signed)
 Asthma getting worst

## 2024-04-19 NOTE — Progress Notes (Signed)
 Subjective:    Christina Gilbert - 28 y.o. female MRN [id number]  Date of birth: Jun 29, 1996  HPI  Christina Gilbert is to establish care.   Current issues and/or concerns: - Blood pressure check. States she never took Amlodipine  because she feels like medications can sometimes cause other health issues to develop. She does not complain of red flag symptoms such as but not limited to chest pain, shortness of breath, worst headache of life, nausea/vomiting.  - Left ear improved.  - Asthma. Doing well on Albuterol , no issues/concerns. Denies red flag symptoms.  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 She has been exposed to tobacco smoke. She has never used smokeless tobacco. She reports that she does not currently use alcohol. She reports that she does not use drugs.   Family History  family history includes Cholelithiasis in her maternal grandfather; Diabetes in her father and mother; Hypertension in her father and mother.   Medications: reviewed  and updated   Objective:   Physical Exam BP 136/83   Pulse 98   Temp 98.1 F (36.7 C) (Oral)   Resp 18   Ht 5' 6 (1.676 m)   Wt 288 lb (130.6 kg)   LMP  (Exact Date)   SpO2 100%   BMI 46.48 kg/m   Physical Exam HENT:     Head: Normocephalic and atraumatic.     Right Ear: Tympanic membrane, ear canal and external ear normal.     Left Ear: Tympanic membrane, ear canal and external ear normal.     Nose: Nose normal.     Mouth/Throat:     Mouth: Mucous membranes are moist.     Pharynx: Oropharynx is clear.  Eyes:     Extraocular Movements: Extraocular movements intact.     Conjunctiva/sclera: Conjunctivae normal.     Pupils: Pupils are equal, round, and reactive to light.  Cardiovascular:     Rate and Rhythm: Normal rate and regular rhythm.     Pulses: Normal pulses.     Heart sounds: Normal heart sounds.  Pulmonary:     Effort: Pulmonary effort is normal.     Breath sounds: Normal breath sounds.  Musculoskeletal:        General: Normal range of motion.     Cervical back: Normal range of motion and neck supple.  Neurological:     General: No focal deficit present.     Mental Status: She is alert and oriented to person, place, and time.  Psychiatric:  Mood and Affect: Mood normal.        Behavior: Behavior normal.       Assessment & Plan:  1. Encounter to establish care (Primary) - Patient presents today to establish care. During the interim follow-up with primary provider as scheduled.  - Return for annual physical examination, labs, and health maintenance. Arrive fasting meaning having no food for at least 8 hours prior to appointment. You may have only water or black coffee. Please take scheduled medications as normal.  2. Blood pressure check - Patient states she never began taking Amlodipine .  - Blood pressure normal today in office.  - Follow-up with primary provider as scheduled.  3. Hearing loss of left ear due to cerumen impaction 4. Impacted  cerumen of left ear - Resolved.  5. Mild intermittent asthma, unspecified whether complicated - Continue Albuterol  inhaler as prescribed. Counseled on medication adherence/adverse effects.  - Follow-up with primary provider as scheduled. - albuterol  (VENTOLIN  HFA) 108 (90 Base) MCG/ACT inhaler; Inhale 2 puffs into the lungs every 6 (six) hours as needed for wheezing or shortness of breath.  Dispense: 18 g; Refill: 2    Patient was given clear instructions to go to Emergency Department or return to medical center if symptoms don't improve, worsen, or new problems develop.The patient verbalized understanding.  I discussed the assessment and treatment plan with the patient. The patient was provided an opportunity to ask questions and all were answered. The patient agreed with the plan and demonstrated an understanding of the instructions.   The patient was advised to call back or seek an in-person evaluation if the symptoms worsen or if the condition fails to improve as anticipated.    Greig Drones, NP 04/19/2024, 1:55 PM Primary Care at [facility]

## 2024-06-19 ENCOUNTER — Encounter: Admitting: Family

## 2024-06-19 NOTE — Progress Notes (Signed)
 Erroneous encounter-disregard

## 2024-08-31 ENCOUNTER — Encounter: Payer: Self-pay | Admitting: Family

## 2024-12-21 ENCOUNTER — Encounter: Admitting: Family
